# Patient Record
Sex: Female | Born: 1965 | Race: Black or African American | Hispanic: No | Marital: Single | Smoking: Never smoker
Health system: Southern US, Community
[De-identification: ages and names within clinical notes are randomized; demographics above are authoritative.]

## PROBLEM LIST (undated history)

## (undated) DIAGNOSIS — I10 Essential (primary) hypertension: Secondary | ICD-10-CM

## (undated) DIAGNOSIS — T7840XA Allergy, unspecified, initial encounter: Secondary | ICD-10-CM

## (undated) DIAGNOSIS — C50912 Malignant neoplasm of unspecified site of left female breast: Secondary | ICD-10-CM

## (undated) DIAGNOSIS — G43909 Migraine, unspecified, not intractable, without status migrainosus: Secondary | ICD-10-CM

## (undated) DIAGNOSIS — D649 Anemia, unspecified: Secondary | ICD-10-CM

## (undated) DIAGNOSIS — F32A Depression, unspecified: Secondary | ICD-10-CM

## (undated) HISTORY — PX: TUBAL LIGATION: SHX77

## (undated) HISTORY — PX: OTHER SURGICAL HISTORY: SHX169

## (undated) HISTORY — DX: Allergy, unspecified, initial encounter: T78.40XA

---

## 2006-12-27 ENCOUNTER — Ambulatory Visit: Payer: Self-pay | Admitting: Orthopedic Surgery

## 2007-01-03 ENCOUNTER — Ambulatory Visit: Payer: Self-pay | Admitting: Orthopedic Surgery

## 2007-04-21 ENCOUNTER — Other Ambulatory Visit: Payer: Self-pay

## 2007-04-21 ENCOUNTER — Emergency Department: Payer: Self-pay | Admitting: Emergency Medicine

## 2008-01-26 ENCOUNTER — Emergency Department: Payer: Self-pay | Admitting: Emergency Medicine

## 2010-11-01 ENCOUNTER — Emergency Department: Payer: Self-pay | Admitting: Emergency Medicine

## 2010-12-18 ENCOUNTER — Emergency Department: Payer: Self-pay | Admitting: Internal Medicine

## 2011-07-26 ENCOUNTER — Ambulatory Visit: Payer: Self-pay | Admitting: Internal Medicine

## 2011-08-13 ENCOUNTER — Ambulatory Visit: Payer: Self-pay | Admitting: Internal Medicine

## 2012-07-07 ENCOUNTER — Emergency Department: Payer: Self-pay | Admitting: Emergency Medicine

## 2013-02-27 ENCOUNTER — Emergency Department: Payer: Self-pay | Admitting: Emergency Medicine

## 2013-08-15 ENCOUNTER — Emergency Department: Payer: Self-pay | Admitting: Emergency Medicine

## 2017-01-08 ENCOUNTER — Encounter: Payer: Self-pay | Admitting: Internal Medicine

## 2017-01-08 ENCOUNTER — Ambulatory Visit (INDEPENDENT_AMBULATORY_CARE_PROVIDER_SITE_OTHER): Payer: 59 | Admitting: Internal Medicine

## 2017-01-08 VITALS — BP 124/86 | HR 72 | Ht 66.0 in | Wt 259.0 lb

## 2017-01-08 DIAGNOSIS — Z1231 Encounter for screening mammogram for malignant neoplasm of breast: Secondary | ICD-10-CM | POA: Diagnosis not present

## 2017-01-08 DIAGNOSIS — G43909 Migraine, unspecified, not intractable, without status migrainosus: Secondary | ICD-10-CM | POA: Diagnosis not present

## 2017-01-08 DIAGNOSIS — Z6841 Body Mass Index (BMI) 40.0 and over, adult: Secondary | ICD-10-CM | POA: Diagnosis not present

## 2017-01-08 DIAGNOSIS — M25561 Pain in right knee: Secondary | ICD-10-CM

## 2017-01-08 DIAGNOSIS — J3089 Other allergic rhinitis: Secondary | ICD-10-CM | POA: Diagnosis not present

## 2017-01-08 DIAGNOSIS — Z1239 Encounter for other screening for malignant neoplasm of breast: Secondary | ICD-10-CM

## 2017-01-08 NOTE — Progress Notes (Signed)
Date:  01/08/2017   Name:  Vickie Gutierrez   DOB:  01/13/1966   MRN:  [id number]   Chief Complaint: Establish Care (Was previous pt but need to be reestablished. ) and Throat Itching (Under chin pt gets itching. "not on outside of skin, but feels more like inside.") Migraine   This is a recurrent problem. The problem occurs intermittently (every 2-3 months). The pain is located in the bilateral and frontal region. Pertinent negatives include no abdominal pain or coughing. She has tried NSAIDs for the symptoms.  Seasonal allergies - she has fairly constant sx that are mostly mild, watery eyes and sneezing.  She also has itching in her throat - inside at times.  She does not think this is related to food. She does not like to take medication so has taken nothing for her sx.    Review of Systems  Constitutional: Negative for chills, diaphoresis and unexpected weight change.  HENT: Negative for trouble swallowing.        Itching in throat  Eyes: Positive for itching. Negative for visual disturbance.  Respiratory: Negative for cough, chest tightness and shortness of breath.   Cardiovascular: Negative for chest pain and palpitations.  Gastrointestinal: Negative for abdominal pain.  Endocrine: Negative for polydipsia and polyuria.  Genitourinary: Negative for difficulty urinating and menstrual problem.  Musculoskeletal: Positive for arthralgias (right knee).  Allergic/Immunologic: Positive for environmental allergies.    There are no active problems to display for this patient.   Prior to Admission medications   Not on File    No Known Allergies  Past Surgical History:  Procedure Laterality Date  . carpel tunnel    . TUBAL LIGATION      Social History  Substance Use Topics  . Smoking status: Never Smoker  . Smokeless tobacco: Never Used  . Alcohol use No     Medication list has been reviewed and updated.   Physical Exam  Constitutional: She is oriented to person,  place, and time. She appears well-developed. No distress.  HENT:  Head: Normocephalic and atraumatic.  Mouth/Throat: No posterior oropharyngeal edema or posterior oropharyngeal erythema.  Neck: Normal range of motion. Neck supple. No tracheal tenderness present. Carotid bruit is not present. No thyromegaly present.  Cardiovascular: Normal rate, regular rhythm and normal heart sounds.   Pulmonary/Chest: Effort normal and breath sounds normal. No respiratory distress. She has no wheezes.  Musculoskeletal: She exhibits no edema.       Right knee: She exhibits normal range of motion, no swelling and no effusion. Tenderness found.  Neurological: She is alert and oriented to person, place, and time.  Skin: Skin is warm and dry. No rash noted.  Psychiatric: She has a normal mood and affect. Her behavior is normal. Thought content normal.  Nursing note and vitals reviewed.   BP 124/86 (BP Location: Right Arm, Patient Position: Sitting, Cuff Size: Large)   Pulse 72   Ht 5\' 6"  (1.676 m)   Wt 259 lb (117.5 kg)   SpO2 98%   BMI 41.80 kg/m   Assessment and Plan: 1. Environmental and seasonal allergies Consider trial of Claritin for sx relief  2. Arthralgia of right knee May take tylenol of advil PRN  3. Breast cancer screening - MM DIGITAL SCREENING BILATERAL; Future  4. BMI 40.0-44.9, adult [facility](HCC) Discussed dietary changes   No orders of the defined types were placed in this encounter.  Patient will schedule CPX for Pap and labs.  Vernona RiegerLaura  Judithann Graves, MD [facility]  01/08/2017

## 2017-01-08 NOTE — Patient Instructions (Addendum)
Consider trying Claritin 10 mg once a day - a 2 week course. Breast Self-Awareness Breast self-awareness means being familiar with how your breasts look and feel. It involves checking your breasts regularly and reporting any changes to your health care provider. Practicing breast self-awareness is important. A change in your breasts can be a sign of a serious medical problem. Being familiar with how your breasts look and feel allows you to find any problems early, when treatment is more likely to be successful. All women should practice breast self-awareness, including women who have had breast implants. How to do a breast self-exam One way to learn what is normal for your breasts and whether your breasts are changing is to do a breast self-exam. To do a breast self-exam: Look for Changes   1. Remove all the clothing above your waist. 2. Stand in front of a mirror in a room with good lighting. 3. Put your hands on your hips. 4. Push your hands firmly downward. 5. Compare your breasts in the mirror. Look for differences between them (asymmetry), such as:  Differences in shape.  Differences in size.  Puckers, dips, and bumps in one breast and not the other. 6. Look at each breast for changes in your skin, such as:  Redness.  Scaly areas. 7. Look for changes in your nipples, such as:  Discharge.  Bleeding.  Dimpling.  Redness.  A change in position. Feel for Changes   Carefully feel your breasts for lumps and changes. It is best to do this while lying on your back on the floor and again while sitting or standing in the shower or tub with soapy water on your skin. Feel each breast in the following way:  Place the arm on the side of the breast you are examining above your head.  Feel your breast with the other hand.  Start in the nipple area and make  inch (2 cm) overlapping circles to feel your breast. Use the pads of your three middle fingers to do this. Apply light pressure,  then medium pressure, then firm pressure. The light pressure will allow you to feel the tissue closest to the skin. The medium pressure will allow you to feel the tissue that is a little deeper. The firm pressure will allow you to feel the tissue close to the ribs.  Continue the overlapping circles, moving downward over the breast until you feel your ribs below your breast.  Move one finger-width toward the center of the body. Continue to use the  inch (2 cm) overlapping circles to feel your breast as you move slowly up toward your collarbone.  Continue the up and down exam using all three pressures until you reach your armpit. Write Down What You Find   Write down what is normal for each breast and any changes that you find. Keep a written record with breast changes or normal findings for each breast. By writing this information down, you do not need to depend only on memory for size, tenderness, or location. Write down where you are in your menstrual cycle, if you are still menstruating. If you are having trouble noticing differences in your breasts, do not get discouraged. With time you will become more familiar with the variations in your breasts and more comfortable with the exam. How often should I examine my breasts? Examine your breasts every month. If you are breastfeeding, the best time to examine your breasts is after a feeding or after using a breast pump.  If you menstruate, the best time to examine your breasts is 5-7 days after your period is over. During your period, your breasts are lumpier, and it may be more difficult to notice changes. When should I see my health care provider? See your health care provider if you notice:  A change in shape or size of your breasts or nipples.  A change in the skin of your breast or nipples, such as a reddened or scaly area.  Unusual discharge from your nipples.  A lump or thick area that was not there before.  Pain in your  breasts.  Anything that concerns you. This information is not intended to replace advice given to you by your health care provider. Make sure you discuss any questions you have with your health care provider. Document Released: 10/08/2005 Document Revised: 03/15/2016 Document Reviewed: 08/28/2015 Elsevier Interactive Patient Education  2017 [employer].

## 2017-05-09 ENCOUNTER — Encounter: Payer: Self-pay | Admitting: Internal Medicine

## 2017-05-09 ENCOUNTER — Ambulatory Visit (INDEPENDENT_AMBULATORY_CARE_PROVIDER_SITE_OTHER): Payer: 59 | Admitting: Internal Medicine

## 2017-05-09 VITALS — BP 144/84 | HR 66 | Ht 66.0 in | Wt 255.0 lb

## 2017-05-09 DIAGNOSIS — G43909 Migraine, unspecified, not intractable, without status migrainosus: Secondary | ICD-10-CM | POA: Diagnosis not present

## 2017-05-09 DIAGNOSIS — R030 Elevated blood-pressure reading, without diagnosis of hypertension: Secondary | ICD-10-CM | POA: Diagnosis not present

## 2017-05-09 DIAGNOSIS — Z1231 Encounter for screening mammogram for malignant neoplasm of breast: Secondary | ICD-10-CM | POA: Diagnosis not present

## 2017-05-09 DIAGNOSIS — Z1211 Encounter for screening for malignant neoplasm of colon: Secondary | ICD-10-CM | POA: Diagnosis not present

## 2017-05-09 DIAGNOSIS — I10 Essential (primary) hypertension: Secondary | ICD-10-CM | POA: Insufficient documentation

## 2017-05-09 DIAGNOSIS — F329 Major depressive disorder, single episode, unspecified: Secondary | ICD-10-CM

## 2017-05-09 DIAGNOSIS — Z0000 Encounter for general adult medical examination without abnormal findings: Secondary | ICD-10-CM

## 2017-05-09 DIAGNOSIS — Z1239 Encounter for other screening for malignant neoplasm of breast: Secondary | ICD-10-CM

## 2017-05-09 LAB — POCT URINALYSIS DIPSTICK
Bilirubin, UA: NEGATIVE
GLUCOSE UA: NEGATIVE
KETONES UA: NEGATIVE
LEUKOCYTES UA: NEGATIVE
Nitrite, UA: NEGATIVE
PROTEIN UA: NEGATIVE
Spec Grav, UA: <=1.005 — AB (ref 1.010–1.025)
Urobilinogen, UA: 0.2 E.U./dL
pH, UA: 5 (ref 5.0–8.0)

## 2017-05-09 MED ORDER — CITALOPRAM HYDROBROMIDE 20 MG PO TABS: 20.0000 mg | ORAL_TABLET | Freq: Every day | ORAL | 1 refills | Status: DC

## 2017-05-09 NOTE — Patient Instructions (Signed)

## 2017-05-09 NOTE — Progress Notes (Signed)
Date:  05/09/2017   Name:  Vickie Gutierrez   DOB:  07/06/1966   MRN:  [id number]   Chief Complaint: Annual Exam (PHQ9- 18. Breast exam and pap. ) Vickie Gutierrez is a 51 y.o. female who presents today for her Complete Annual Exam. She feels poorly. She reports exercising none. She reports she is sleeping poorly. Grieveing over her mothers death in 01/13/2023.  Not getting better.  Poor sleep, negative thoughts, frequent crying, low energy. She is still getting to work daily and taking care of her children.  Migraine HA - still occurring several times per month but essentially unchanged.   Review of Systems  Constitutional: Negative for chills, fatigue and fever.  HENT: Negative for congestion, hearing loss, tinnitus, trouble swallowing and voice change.   Eyes: Negative for visual disturbance.  Respiratory: Negative for cough, chest tightness, shortness of breath and wheezing.   Cardiovascular: Negative for chest pain, palpitations and leg swelling.  Gastrointestinal: Negative for abdominal pain, constipation, diarrhea and vomiting.  Endocrine: Negative for polydipsia and polyuria.  Genitourinary: Negative.  Negative for dysuria, frequency, genital sores, menstrual problem, vaginal bleeding and vaginal discharge.  Musculoskeletal: Negative for arthralgias, gait problem and joint swelling.  Skin: Negative for color change and rash.       Hair loss  Neurological: Positive for headaches (migraines unchanged). Negative for dizziness, tremors and light-headedness.  Hematological: Negative for adenopathy. Does not bruise/bleed easily.  Psychiatric/Behavioral: Positive for dysphoric mood and sleep disturbance. Negative for suicidal ideas. The patient is not nervous/anxious.     Patient Active Problem List   Diagnosis Date Noted  . BMI 40.0-44.9, adult (HCC) 01/08/2017  . Environmental and seasonal allergies 01/08/2017  . Migraine without status migrainosus, not intractable 01/08/2017  .  Arthralgia of right knee 01/08/2017    Prior to Admission medications   Not on File    No Known Allergies  Past Surgical History:  Procedure Laterality Date  . carpel tunnel    . TUBAL LIGATION      Social History  Substance Use Topics  . Smoking status: Never Smoker  . Smokeless tobacco: Never Used  . Alcohol use No   Depression screen PHQ 2/9 05/09/2017  Decreased Interest 2  Down, Depressed, Hopeless 2  PHQ - 2 Score 4  Altered sleeping 3  Tired, decreased energy 3  Change in appetite 1  Feeling bad or failure about yourself  3  Trouble concentrating 1  Moving slowly or fidgety/restless 1  Suicidal thoughts 2  PHQ-9 Score 18  Difficult doing work/chores Extremely dIfficult     Medication list has been reviewed and updated.   Physical Exam  Constitutional: She is oriented to person, place, and time. She appears well-developed and well-nourished. No distress.  HENT:  Head: Normocephalic and atraumatic.  Right Ear: Tympanic membrane and ear canal normal.  Left Ear: Tympanic membrane and ear canal normal.  Nose: Right sinus exhibits no maxillary sinus tenderness. Left sinus exhibits no maxillary sinus tenderness.  Mouth/Throat: Uvula is midline and oropharynx is clear and moist.  Eyes: Conjunctivae and EOM are normal. Right eye exhibits no discharge. Left eye exhibits no discharge. No scleral icterus.  Neck: Normal range of motion. Carotid bruit is not present. No erythema present. No thyromegaly present.  Cardiovascular: Normal rate, regular rhythm, normal heart sounds and normal pulses.   Pulmonary/Chest: Effort normal. No respiratory distress. She has no wheezes. Right breast exhibits no mass, no nipple discharge, no skin  change and no tenderness. Left breast exhibits no mass, no nipple discharge, no skin change and no tenderness.  Abdominal: Soft. Bowel sounds are normal. There is no hepatosplenomegaly. There is no tenderness. There is no CVA tenderness.    Genitourinary: Vagina normal and uterus normal. There is no rash, tenderness or lesion on the right labia. There is no rash, tenderness or lesion on the left labia. Cervix exhibits no motion tenderness, no discharge and no friability. Right adnexum displays no mass, no tenderness and no fullness. Left adnexum displays no mass, no tenderness and no fullness.  Musculoskeletal: Normal range of motion.  Lymphadenopathy:    She has no cervical adenopathy.    She has no axillary adenopathy.  Neurological: She is alert and oriented to person, place, and time. She has normal reflexes. No cranial nerve deficit or sensory deficit.  Skin: Skin is warm, dry and intact. No rash noted.  Psychiatric: Her speech is normal and behavior is normal. Thought content normal. Her mood appears not anxious. Cognition and memory are normal. She exhibits a depressed mood.  Nursing note and vitals reviewed.   BP (!) 144/84   Pulse 66   Ht 5\' 6"  (1.676 m)   Wt 255 lb (115.7 kg)   LMP 10/22/2012 (Approximate) Comment: no periods.   SpO2 100%   BMI 41.16 kg/m   Assessment and Plan: 1. Annual physical exam BP elevated due to current stress and emotional upset Will recheck next visit - CBC with Differential/Platelet - Comprehensive metabolic panel - Lipid panel - POCT urinalysis dipstick - Pap IG and HPV (high risk) DNA detection  2. Breast cancer screening - MM DIGITAL SCREENING BILATERAL; Future  3. Colon cancer screening Will discuss next visit  4. Migraine without status migrainosus, not intractable, unspecified migraine type Stable sx  5. Reactive depression More prolonged than expected - begin medication - TSH - citalopram (CELEXA) 20 MG tablet; Take 1 tablet (20 mg total) by mouth daily.  Dispense: 30 tablet; Refill: 1   Meds ordered this encounter  Medications  . citalopram (CELEXA) 20 MG tablet    Sig: Take 1 tablet (20 mg total) by mouth daily.    Dispense:  30 tablet    Refill:  1     Bari EdwardLaura Taylour Lietzke, MD [facility]  05/09/2017

## 2017-05-10 LAB — COMPREHENSIVE METABOLIC PANEL
ALBUMIN: 4.2 g/dL (ref 3.5–5.5)
ALK PHOS: 95 IU/L (ref 39–117)
ALT: 9 IU/L (ref 0–32)
AST: 8 IU/L (ref 0–40)
Albumin/Globulin Ratio: 1.4 (ref 1.2–2.2)
BILIRUBIN TOTAL: 0.2 mg/dL (ref 0.0–1.2)
BUN / CREAT RATIO: 11 (ref 9–23)
BUN: 11 mg/dL (ref 6–24)
CO2: 24 mmol/L (ref 20–29)
CREATININE: 1.03 mg/dL — AB (ref 0.57–1.00)
Calcium: 9.7 mg/dL (ref 8.7–10.2)
Chloride: 104 mmol/L (ref 96–106)
GFR calc non Af Amer: 64 mL/min/{1.73_m2} (ref >59)
GFR, EST AFRICAN AMERICAN: 73 mL/min/{1.73_m2} (ref >59)
GLOBULIN, TOTAL: 3.1 g/dL (ref 1.5–4.5)
Glucose: 82 mg/dL (ref 65–99)
Potassium: 5 mmol/L (ref 3.5–5.2)
SODIUM: 145 mmol/L — AB (ref 134–144)
TOTAL PROTEIN: 7.3 g/dL (ref 6.0–8.5)

## 2017-05-10 LAB — CBC WITH DIFFERENTIAL/PLATELET
BASOS: 0 %
Basophils Absolute: 0 10*3/uL (ref 0.0–0.2)
EOS (ABSOLUTE): 0.2 10*3/uL (ref 0.0–0.4)
Eos: 3 %
Hematocrit: 39.4 % (ref 34.0–46.6)
Hemoglobin: 12.8 g/dL (ref 11.1–15.9)
Immature Grans (Abs): 0 10*3/uL (ref 0.0–0.1)
Immature Granulocytes: 0 %
LYMPHS ABS: 2.1 10*3/uL (ref 0.7–3.1)
Lymphs: 31 %
MCH: 27.4 pg (ref 26.6–33.0)
MCHC: 32.5 g/dL (ref 31.5–35.7)
MCV: 84 fL (ref 79–97)
MONOS ABS: 0.5 10*3/uL (ref 0.1–0.9)
Monocytes: 7 %
NEUTROS ABS: 4.1 10*3/uL (ref 1.4–7.0)
Neutrophils: 59 %
Platelets: 246 10*3/uL (ref 150–379)
RBC: 4.67 x10E6/uL (ref 3.77–5.28)
RDW: 15 % (ref 12.3–15.4)
WBC: 6.9 10*3/uL (ref 3.4–10.8)

## 2017-05-10 LAB — LIPID PANEL
CHOL/HDL RATIO: 2.8 ratio (ref 0.0–4.4)
Cholesterol, Total: 143 mg/dL (ref 100–199)
HDL: 51 mg/dL (ref >39)
LDL Calculated: 76 mg/dL (ref 0–99)
TRIGLYCERIDES: 79 mg/dL (ref 0–149)
VLDL Cholesterol Cal: 16 mg/dL (ref 5–40)

## 2017-05-10 LAB — TSH: TSH: 2.26 u[IU]/mL (ref 0.450–4.500)

## 2017-05-11 LAB — PAP IG AND HPV HIGH-RISK
HPV, HIGH-RISK: NEGATIVE
PAP Smear Comment: 0

## 2017-05-11 LAB — PLEASE NOTE

## 2017-06-26 ENCOUNTER — Ambulatory Visit (INDEPENDENT_AMBULATORY_CARE_PROVIDER_SITE_OTHER): Payer: 59 | Admitting: Internal Medicine

## 2017-06-26 ENCOUNTER — Encounter: Payer: Self-pay | Admitting: Internal Medicine

## 2017-06-26 VITALS — BP 138/80 | HR 67 | Ht 66.0 in | Wt 258.0 lb

## 2017-06-26 DIAGNOSIS — S63633A Sprain of interphalangeal joint of left middle finger, initial encounter: Secondary | ICD-10-CM | POA: Diagnosis not present

## 2017-06-26 DIAGNOSIS — F329 Major depressive disorder, single episode, unspecified: Secondary | ICD-10-CM

## 2017-06-26 NOTE — Patient Instructions (Signed)
Try celexa again - in evening 1/4 tab for 6 days, then 1/2 tab for 6 days, then 1 tab daily

## 2017-06-26 NOTE — Progress Notes (Signed)
Date:  06/26/2017   Name:  Vickie ReveringZsa Zsa M Flagler   DOB:  06/23/1966   MRN:  [id number]   Chief Complaint: Depression (Patient only took Celexa for 1 day. Did not like the way it made her feel. Made her feel nausea. PHQ9- 6 ) and Hand Pain (Almost a week- Middle Left finger is painful. Can't bend all the way down and is swollen. )  Finger pain - left middle finger swollen and stiff for the past week.  Also right thumb stiff at [facility] where she stabbed it with a piece of glass.   Depression - actually doing better but was unable to take it due to lethargy and dizziness.  She also had some nausea.  She only took one.  Review of Systems  Constitutional: Negative for fatigue and fever.  Respiratory: Negative for chest tightness, shortness of breath and wheezing.   Cardiovascular: Negative for chest pain, palpitations and leg swelling.  Musculoskeletal: Positive for arthralgias and joint swelling. Negative for gait problem.  Skin: Negative for color change and rash.  Neurological: Negative for dizziness and headaches.  Psychiatric/Behavioral: Positive for dysphoric mood. Negative for sleep disturbance.    Patient Active Problem List   Diagnosis Date Noted  . Elevated blood pressure reading 05/09/2017  . Reactive depression 05/09/2017  . BMI 40.0-44.9, adult (HCC) 01/08/2017  . Environmental and seasonal allergies 01/08/2017  . Migraine without status migrainosus, not intractable 01/08/2017  . Arthralgia of right knee 01/08/2017    Prior to Admission medications   Medication Sig Start Date End Date Taking? Authorizing Provider  citalopram (CELEXA) 20 MG tablet Take 1 tablet (20 mg total) by mouth daily. Patient not taking: Reported on 06/26/2017 05/09/17   Reubin MilanBerglund, Laura H, MD    No Known Allergies  Past Surgical History:  Procedure Laterality Date  . carpel tunnel    . TUBAL LIGATION      Social History  Substance Use Topics  . Smoking status: Never Smoker  . Smokeless tobacco:  Never Used  . Alcohol use No     Medication list has been reviewed and updated.  PHQ 2/9 Scores 06/26/2017 05/09/2017  PHQ - 2 Score 1 4  PHQ- 9 Score 6 18    Physical Exam  Constitutional: She is oriented to person, place, and time. She appears well-developed. No distress.  HENT:  Head: Normocephalic and atraumatic.  Cardiovascular: Normal rate, regular rhythm and normal heart sounds.   Pulmonary/Chest: Effort normal. No respiratory distress. She has wheezes.  Musculoskeletal: Normal range of motion.  Left middle finger swollen at PIP - not red or warm Able to flex about halfway  Right thumb prox joint tender - not swollen  Neurological: She is alert and oriented to person, place, and time.  Skin: Skin is warm and dry. No rash noted.  Psychiatric: She has a normal mood and affect. Her behavior is normal. Thought content normal.  Nursing note and vitals reviewed.   BP 138/80   Pulse 67   Ht 5\' 6"  (1.676 m)   Wt 258 lb (117 kg)   LMP 10/22/2012 (Approximate) Comment: no periods.   SpO2 100%   BMI 41.64 kg/m   Assessment and Plan: 1. Reactive depression Restart Celexa 1/4 dose then work up slowly  2. Sprain of interphalangeal joint of left middle finger, initial encounter Recommend Advil bid Follow up if no improvement   No orders of the defined types were placed in this encounter.   Partially dictated  using Animal nutritionist. Any errors are unintentional.  Bari Edward, MD [facility]  06/26/2017

## 2017-07-03 ENCOUNTER — Ambulatory Visit
Admission: RE | Admit: 2017-07-03 | Discharge: 2017-07-03 | Disposition: A | Discharge status: Home / Self Care | Payer: 59 | Source: Ambulatory Visit | Attending: Internal Medicine | Admitting: Internal Medicine

## 2017-07-03 ENCOUNTER — Encounter: Payer: Self-pay | Admitting: Internal Medicine

## 2017-07-03 ENCOUNTER — Other Ambulatory Visit: Payer: Self-pay | Admitting: Internal Medicine

## 2017-07-03 ENCOUNTER — Ambulatory Visit (INDEPENDENT_AMBULATORY_CARE_PROVIDER_SITE_OTHER): Payer: 59 | Admitting: Internal Medicine

## 2017-07-03 VITALS — BP 128/84 | HR 67 | Ht 66.0 in | Wt 258.0 lb

## 2017-07-03 DIAGNOSIS — M25542 Pain in joints of left hand: Secondary | ICD-10-CM

## 2017-07-03 DIAGNOSIS — M25442 Effusion, left hand: Secondary | ICD-10-CM

## 2017-07-03 NOTE — Progress Notes (Signed)
    Date:  07/03/2017   Name:  Vickie Gutierrez   DOB:  06/02/1966   MRN:  [id number]   Chief Complaint: Hand Pain (Finger getting worse - Swollen. Left Middle Finger )  Pain in left middle finger persists. It is still swollen and slightly warm. Again reviewed no history of injury. No history of rheumatologic disorders. No history of gout. She's been taking ibuprofen without much benefit. No other joints and become swollen or painful.  Review of Systems  Constitutional: Negative for chills, fatigue and fever.  Respiratory: Negative for chest tightness and shortness of breath.   Cardiovascular: Negative for chest pain.  Musculoskeletal: Positive for arthralgias and joint swelling.  Neurological: Negative for weakness and numbness.    Patient Active Problem List   Diagnosis Date Noted  . Elevated blood pressure reading 05/09/2017  . Reactive depression 05/09/2017  . BMI 40.0-44.9, adult (HCC) 01/08/2017  . Environmental and seasonal allergies 01/08/2017  . Migraine without status migrainosus, not intractable 01/08/2017  . Arthralgia of right knee 01/08/2017    Prior to Admission medications   Medication Sig Start Date End Date Taking? Authorizing Provider  citalopram (CELEXA) 20 MG tablet Take 1 tablet (20 mg total) by mouth daily. 05/09/17  Yes Reubin MilanBerglund, Alexio Sroka H, MD    No Known Allergies  Past Surgical History:  Procedure Laterality Date  . carpel tunnel    . TUBAL LIGATION      Social History  Substance Use Topics  . Smoking status: Never Smoker  . Smokeless tobacco: Never Used  . Alcohol use No     Medication list has been reviewed and updated.  PHQ 2/9 Scores 06/26/2017 05/09/2017  PHQ - 2 Score 1 4  PHQ- 9 Score 6 18    Physical Exam  Constitutional: She is oriented to person, place, and time. She appears well-developed. No distress.  HENT:  Head: Normocephalic and atraumatic.  Cardiovascular: Normal rate, regular rhythm and normal heart sounds.     Pulmonary/Chest: Effort normal and breath sounds normal. No respiratory distress. She has no wheezes.  Musculoskeletal: Normal range of motion.  Left middle finger swollen at PIP - not red but slightly warm to touch Able to flex about halfway  Right thumb prox joint non tender  Neurological: She is alert and oriented to person, place, and time.  Skin: Skin is warm and dry. No rash noted.  Psychiatric: She has a normal mood and affect. Her speech is normal and behavior is normal. Thought content normal.  Nursing note and vitals reviewed.   BP 128/84   Pulse 67   Ht 5\' 6"  (1.676 m)   Wt 258 lb (117 kg)   LMP 10/22/2012 (Approximate) Comment: no periods.   SpO2 100%   BMI 41.64 kg/m   Assessment and Plan: 1. Pain involving joint of finger of left hand Patient declines serologic testing - maybe xray will provide some etiology - DG Finger Middle Left; Future   No orders of the defined types were placed in this encounter.   Partially dictated using Animal nutritionistDragon software. Any errors are unintentional.  Bari EdwardLaura Camilah Spillman, MD [facility]  07/03/2017

## 2017-11-14 ENCOUNTER — Encounter (HOSPITAL_COMMUNITY): Payer: Self-pay | Admitting: Emergency Medicine

## 2017-11-14 ENCOUNTER — Ambulatory Visit (HOSPITAL_COMMUNITY)
Admission: EM | Admit: 2017-11-14 | Discharge: 2017-11-14 | Disposition: A | Discharge status: Home / Self Care | Payer: BLUE CROSS/BLUE SHIELD | Attending: Family Medicine | Admitting: Family Medicine

## 2017-11-14 DIAGNOSIS — R69 Illness, unspecified: Secondary | ICD-10-CM | POA: Diagnosis not present

## 2017-11-14 DIAGNOSIS — J111 Influenza due to unidentified influenza virus with other respiratory manifestations: Secondary | ICD-10-CM

## 2017-11-14 MED ORDER — GUAIFENESIN-DM 100-10 MG/5ML PO SYRP: 10.0000 mL | ORAL_SOLUTION | Freq: Four times a day (QID) | ORAL | 0 refills | Status: AC | PRN

## 2017-11-14 MED ORDER — ONDANSETRON HCL 4 MG PO TABS: 4.0000 mg | ORAL_TABLET | Freq: Four times a day (QID) | ORAL | 0 refills | Status: AC

## 2017-11-14 MED ORDER — HYDROCODONE-HOMATROPINE 5-1.5 MG/5ML PO SYRP: 5.0000 mL | ORAL_SOLUTION | Freq: Four times a day (QID) | ORAL | 0 refills | Status: AC | PRN

## 2017-11-14 NOTE — ED Provider Notes (Signed)
[facility]    CSN: [id number] Arrival date & time: 11/14/17  1353     History   Chief Complaint Chief Complaint  Patient presents with  . URI    HPI Vickie Gutierrez is a 52 y.o. female Patient is presenting with URI symptoms- congestion, cough, sore throat. Also having fever earlier in the week up to 101. Body aches. Endorsing nausea, vomiting 3 days ago, loss of appetite. Patient's main complaints are overall feeling poor/weak. Symptoms have been going on for 4-5 days. Patient has tried ibuprofen Saturday night, has not tried OTC medicaitons. Deniesdiarrhea. Endorsing some shortness of breath and weakness. Chest pain from coughing. No flu shot this year, son recently with flu.   HPI  Past Medical History:  Diagnosis Date  . Allergy    seasonal    Patient Active Problem List   Diagnosis Date Noted  . Elevated blood pressure reading 05/09/2017  . Reactive depression 05/09/2017  . BMI 40.0-44.9, adult (HCC) 01/08/2017  . Environmental and seasonal allergies 01/08/2017  . Migraine without status migrainosus, not intractable 01/08/2017  . Arthralgia of right knee 01/08/2017    Past Surgical History:  Procedure Laterality Date  . carpel tunnel    . TUBAL LIGATION      OB History    No data available       Home Medications    Prior to Admission medications   Medication Sig Start Date End Date Taking? Authorizing Provider  citalopram (CELEXA) 20 MG tablet Take 1 tablet (20 mg total) by mouth daily. 05/09/17   Reubin MilanBerglund, Laura H, MD  guaiFENesin-dextromethorphan (ROBITUSSIN DM) 100-10 MG/5ML syrup Take 10 mLs by mouth every 6 (six) hours as needed for up to 5 days for cough. 11/14/17 11/19/17  Wieters, Hallie C, PA-C  HYDROcodone-homatropine (HYCODAN) 5-1.5 MG/5ML syrup Take 5 mLs by mouth every 6 (six) hours as needed for up to 5 days for cough. Take at bedtime 11/14/17 11/19/17  Wieters, Hallie C, PA-C  ondansetron (ZOFRAN) 4 MG tablet Take 1 tablet (4 mg  total) by mouth every 6 (six) hours for 4 days. 11/14/17 11/18/17  Wieters, Junius CreamerHallie C, PA-C    Family History Family History  Problem Relation Age of Onset  . Ovarian cancer Mother   . Diabetes Mother   . Hypertension Mother   . Diabetes Maternal Grandmother   . Hypertension Maternal Grandmother     Social History Social History   Tobacco Use  . Smoking status: Never Smoker  . Smokeless tobacco: Never Used  Substance Use Topics  . Alcohol use: No  . Drug use: No     Allergies   Patient has no known allergies.   Review of Systems Review of Systems  Constitutional: Positive for fever. Negative for chills and fatigue.  HENT: Positive for congestion, ear pain, rhinorrhea, sinus pressure and sore throat. Negative for trouble swallowing.   Respiratory: Positive for cough and shortness of breath. Negative for chest tightness.   Cardiovascular: Negative for chest pain.  Gastrointestinal: Positive for nausea and vomiting. Negative for abdominal pain.  Musculoskeletal: Positive for myalgias.  Skin: Negative for rash.  Neurological: Positive for dizziness and weakness. Negative for light-headedness and headaches.     Physical Exam Triage Vital Signs ED Triage Vitals  Enc Vitals Group     BP 11/14/17 1409 (!) 141/99     Pulse Rate 11/14/17 1409 86     Resp 11/14/17 1409 16     Temp 11/14/17 1409 98.6  F (37 C)     Temp Source 11/14/17 1409 Oral     SpO2 11/14/17 1409 97 %     Weight 11/14/17 1408 250 lb (113.4 kg)     Height 11/14/17 1408 5\' 6"  (1.676 m)     Head Circumference --      Peak Flow --      Pain Score 11/14/17 1407 6     Pain Loc --      Pain Edu? --      Excl. in GC? --    No data found.  Updated Vital Signs BP (!) 141/99   Pulse 86   Temp 98.6 F (37 C) (Oral)   Resp 16   Ht 5\' 6"  (1.676 m)   Wt 250 lb (113.4 kg)   LMP 10/22/2012 (Approximate) Comment: no periods.   SpO2 97%   BMI 40.35 kg/m   Physical Exam  Constitutional: She is oriented  to person, place, and time. She appears well-developed and well-nourished. No distress.  HENT:  Head: Normocephalic and atraumatic.  Right Ear: Tympanic membrane and ear canal normal.  Left Ear: Tympanic membrane and ear canal normal.  Nose: Rhinorrhea present.  Mouth/Throat: Uvula is midline and mucous membranes are normal. No oral lesions. No trismus in the jaw. No uvula swelling. Posterior oropharyngeal erythema present. No tonsillar exudate.  Erythematous turbinates  Eyes: Conjunctivae are normal.  Neck: Neck supple.  Cardiovascular: Normal rate and regular rhythm.  No murmur heard. Pulmonary/Chest: Effort normal and breath sounds normal. No respiratory distress. She has no wheezes. She has no rales.  CTABL, no adventitious sounds, breathing comfortably at rest  Abdominal: Soft. There is no tenderness.  Musculoskeletal: She exhibits no edema.  Neurological: She is alert and oriented to person, place, and time.  Skin: Skin is warm and dry.  Psychiatric: She has a normal mood and affect.  Nursing note and vitals reviewed.    UC Treatments / Results  Labs (all labs ordered are listed, but only abnormal results are displayed) Labs Reviewed - No data to display  EKG  EKG Interpretation None       Radiology No results found.  Procedures Procedures (including critical care time)  Medications Ordered in UC Medications - No data to display   Initial Impression / Assessment and Plan / UC Course  I have reviewed the triage vital signs and the nursing notes.  Pertinent labs & imaging results that were available during my care of the patient were reviewed by me and considered in my medical decision making (see chart for details).     Patient presents with symptoms likely from influenza vs. viral upper respiratory infection. Seems to be slightly improved from earlier in the week. VSS, deferred Chest XR as lungs clear, O2 97%, no fever or tachycardia. Do not suspect  underlying cardiopulmonary process. Symptoms seem unlikely related to ACS, CHF or COPD exacerbations, pneumonia, pneumothorax. Patient is nontoxic appearing and not in need of emergent medical intervention.  Expect worst of symptoms to be behind patient, expect slow gradual improvement over next week. Hycodan for cough at night, Robitussin DM during day. Zofran for nausea- stressed importance of hydration/eating.  Recommended symptom control with over the counter medications: Daily oral anti-histamine, Oral decongestant or IN corticosteroid, saline irrigations, cepacol lozenges, Robitussin, Delsym, honey tea.  Return if symptoms fail to improve in 1-2 weeks or you develop shortness of breath, chest pain, severe headache. Patient states understanding and is agreeable. Discussed strict return precautions. Patient  verbalized understanding and is agreeable with plan.   Final Clinical Impressions(s) / UC Diagnoses   Final diagnoses:  Influenza-like illness    ED Discharge Orders        Ordered    HYDROcodone-homatropine (HYCODAN) 5-1.5 MG/5ML syrup  Every 6 hours PRN     11/14/17 1459    guaiFENesin-dextromethorphan (ROBITUSSIN DM) 100-10 MG/5ML syrup  Every 6 hours PRN     11/14/17 1459    ondansetron (ZOFRAN) 4 MG tablet  Every 6 hours     11/14/17 1459       Controlled Substance Prescriptions [place] Controlled Substance Registry consulted? Yes, I have consulted the [place] Controlled Substances Registry for this patient, and feel the risk/benefit ratio today is favorable for proceeding with this prescription for a controlled substance.   Lew Dawes, PA-C 11/14/17 1511

## 2017-11-14 NOTE — Discharge Instructions (Signed)
You are likely on the tail end of the flu. I expect symptoms to improve over the next week. Please use recommendations below to help control your symptoms:   1. Take a daily allergy pill/anti-histamine like Zyrtec, Claritin, or Store brand consistently for 2 weeks  2. For congestion you may try an oral decongestant like Mucinex or sudafed. You may also try intranasal flonase nasal spray or saline irrigations (neti pot, sinus cleanse)  3. For your sore throat you may try cepacol lozenges, salt water gargles, throat spray. Treatment of congestion may also help your sore throat.  4. For cough you may try Robitussin DM during the day, Hycodan at night to help with nighttime cough- will cause sedation, do not drive after use.   5. Take Tylenol or Ibuprofen to help with pain- alternate every 4 hours  6. Stay hydrated, drink plenty of fluids to keep throat coated and less irritated; try to eat more, start with blander foods then transition to normal diet.   Honey Tea For cough/sore throat try using a honey-based tea. Use 3 teaspoons of honey with juice squeezed from half lemon. Place shaved pieces of ginger into 1/2-1 cup of water and warm over stove top. Then mix the ingredients and repeat every 4 hours as needed.  Please return if symptoms not improving or worsening, develop increased shortness of breath, difficulty breathing, chest pain, fever returns

## 2017-11-14 NOTE — ED Triage Notes (Addendum)
PT reports cough, weakness, dizziness, chest pain with cough for 4-5 days. PT reports she is eating and drinking less than normal. PT reports SOB as well

## 2021-09-28 ENCOUNTER — Other Ambulatory Visit: Payer: Self-pay | Admitting: Family Medicine

## 2021-09-28 DIAGNOSIS — Z1231 Encounter for screening mammogram for malignant neoplasm of breast: Secondary | ICD-10-CM

## 2021-11-15 ENCOUNTER — Other Ambulatory Visit: Payer: Self-pay

## 2021-11-15 ENCOUNTER — Ambulatory Visit
Admission: RE | Admit: 2021-11-15 | Discharge: 2021-11-15 | Disposition: A | Discharge status: Home / Self Care | Payer: BC Managed Care – PPO | Source: Ambulatory Visit | Attending: Family Medicine | Admitting: Family Medicine

## 2021-11-15 DIAGNOSIS — Z1231 Encounter for screening mammogram for malignant neoplasm of breast: Secondary | ICD-10-CM | POA: Diagnosis present

## 2022-10-01 DIAGNOSIS — Z1331 Encounter for screening for depression: Secondary | ICD-10-CM | POA: Diagnosis not present

## 2022-10-01 DIAGNOSIS — Z1211 Encounter for screening for malignant neoplasm of colon: Secondary | ICD-10-CM | POA: Diagnosis not present

## 2022-10-01 DIAGNOSIS — Z0000 Encounter for general adult medical examination without abnormal findings: Secondary | ICD-10-CM | POA: Diagnosis not present

## 2023-04-04 ENCOUNTER — Encounter: Payer: Self-pay | Admitting: Gastroenterology

## 2023-04-05 ENCOUNTER — Encounter: Payer: Self-pay | Admitting: Gastroenterology

## 2023-04-05 ENCOUNTER — Encounter
Admission: RE | Disposition: A | Discharge status: Home / Self Care | Payer: Self-pay | Source: Home / Self Care | Attending: Gastroenterology

## 2023-04-05 ENCOUNTER — Ambulatory Visit: Payer: BC Managed Care – PPO | Admitting: General Practice

## 2023-04-05 ENCOUNTER — Ambulatory Visit
Admission: RE | Admit: 2023-04-05 | Discharge: 2023-04-05 | Disposition: A | Discharge status: Home / Self Care | Payer: BC Managed Care – PPO | Attending: Gastroenterology | Admitting: Gastroenterology

## 2023-04-05 DIAGNOSIS — Z1211 Encounter for screening for malignant neoplasm of colon: Secondary | ICD-10-CM | POA: Diagnosis not present

## 2023-04-05 DIAGNOSIS — K635 Polyp of colon: Secondary | ICD-10-CM | POA: Diagnosis not present

## 2023-04-05 DIAGNOSIS — Z6841 Body Mass Index (BMI) 40.0 and over, adult: Secondary | ICD-10-CM | POA: Diagnosis not present

## 2023-04-05 DIAGNOSIS — D120 Benign neoplasm of cecum: Secondary | ICD-10-CM | POA: Insufficient documentation

## 2023-04-05 DIAGNOSIS — F32A Depression, unspecified: Secondary | ICD-10-CM | POA: Diagnosis not present

## 2023-04-05 DIAGNOSIS — D125 Benign neoplasm of sigmoid colon: Secondary | ICD-10-CM | POA: Diagnosis not present

## 2023-04-05 DIAGNOSIS — D123 Benign neoplasm of transverse colon: Secondary | ICD-10-CM | POA: Diagnosis not present

## 2023-04-05 HISTORY — PX: COLONOSCOPY WITH PROPOFOL: SHX5780

## 2023-04-05 SURGERY — COLONOSCOPY WITH PROPOFOL
Anesthesia: General

## 2023-04-05 MED ORDER — SODIUM CHLORIDE 0.9 % IV SOLN
INTRAVENOUS | Status: DC
  Administered 2023-04-05: 20 mL/h via INTRAVENOUS

## 2023-04-05 MED ORDER — LIDOCAINE HCL (CARDIAC) PF 100 MG/5ML IV SOSY
PREFILLED_SYRINGE | INTRAVENOUS | Status: DC | PRN
  Administered 2023-04-05: 50 mg via INTRAVENOUS

## 2023-04-05 MED ORDER — PROPOFOL 10 MG/ML IV BOLUS
INTRAVENOUS | Status: DC | PRN
  Administered 2023-04-05: 80 mg via INTRAVENOUS

## 2023-04-05 MED ORDER — PROPOFOL 500 MG/50ML IV EMUL
INTRAVENOUS | Status: DC | PRN
  Administered 2023-04-05: 150 ug/kg/min via INTRAVENOUS

## 2023-04-05 NOTE — Anesthesia Preprocedure Evaluation (Signed)
Anesthesia Evaluation  Patient identified by MRN, date of birth, ID band Patient awake    Reviewed: Allergy & Precautions, NPO status , Patient's Chart, lab work & pertinent test results  Airway Mallampati: III  TM Distance: >3 FB Neck ROM: full    Dental  (+) Chipped   Pulmonary neg pulmonary ROS   Pulmonary exam normal        Cardiovascular negative cardio ROS Normal cardiovascular exam     Neuro/Psych  PSYCHIATRIC DISORDERS  Depression    negative neurological ROS     GI/Hepatic negative GI ROS, Neg liver ROS,,,  Endo/Other    Morbid obesity  Renal/GU negative Renal ROS  negative genitourinary   Musculoskeletal   Abdominal   Peds  Hematology negative hematology ROS (+)   Anesthesia Other Findings Past Medical History: No date: Allergy     Comment:  seasonal  Past Surgical History: No date: carpel tunnel No date: TUBAL LIGATION  BMI    Body Mass Index: 43.97 kg/m      Reproductive/Obstetrics negative OB ROS                             Anesthesia Physical Anesthesia Plan  ASA: 3  Anesthesia Plan: General   Post-op Pain Management: Minimal or no pain anticipated   Induction: Intravenous  PONV Risk Score and Plan: 3 and Propofol infusion, TIVA and Ondansetron  Airway Management Planned: Nasal Cannula  Additional Equipment: None  Intra-op Plan:   Post-operative Plan:   Informed Consent: I have reviewed the patients History and Physical, chart, labs and discussed the procedure including the risks, benefits and alternatives for the proposed anesthesia with the patient or authorized representative who has indicated his/her understanding and acceptance.     Dental advisory given  Plan Discussed with: CRNA and Surgeon  Anesthesia Plan Comments: (Discussed risks of anesthesia with patient, including possibility of difficulty with spontaneous ventilation under  anesthesia necessitating airway intervention, PONV, and rare risks such as cardiac or respiratory or neurological events, and allergic reactions. Discussed the role of CRNA in patient's perioperative care. Patient understands.)        Anesthesia Quick Evaluation

## 2023-04-05 NOTE — Anesthesia Postprocedure Evaluation (Signed)
Anesthesia Post Note  Patient: Vickie Gutierrez  Procedure(s) Performed: COLONOSCOPY WITH PROPOFOL  Patient location during evaluation: PACU Anesthesia Type: General Level of consciousness: awake and alert Pain management: pain level controlled Vital Signs Assessment: post-procedure vital signs reviewed and stable Respiratory status: spontaneous breathing, nonlabored ventilation, respiratory function stable and patient connected to nasal cannula oxygen Cardiovascular status: blood pressure returned to baseline and stable Postop Assessment: no apparent nausea or vomiting Anesthetic complications: no  No notable events documented.   Last Vitals:  Vitals:   04/05/23 1057 04/05/23 1256  BP: (!) 138/106 (!) 142/89  Pulse: 73 68  Resp: 20 18  Temp: (!) 35.7 C (!) 36.1 C  SpO2: 100%     Last Pain:  Vitals:   04/05/23 1256  TempSrc: Temporal  PainSc: Asleep                 Stephanie Coup

## 2023-04-05 NOTE — H&P (Signed)
Pre-Procedure H&P   Patient ID: Vickie Gutierrez is a 57 y.o. female.  Gastroenterology Provider: Jaynie Collins, DO  Referring Provider: Dr. Larwance Sachs PCP: Reubin Milan, MD  Date: 04/05/2023  HPI Ms. Vickie Gutierrez is a 57 y.o. female who presents today for Colonoscopy for colorectal cancer screening   Initial screening colonoscopy.  No family history of colon cancer or colon polyps.  Daily bowel movement without melena or hematochezia.  Hemoglobin 13.7 MCV 87.8 platelets 241,000   Past Medical History:  Diagnosis Date   Allergy    seasonal    Past Surgical History:  Procedure Laterality Date   carpel tunnel     TUBAL LIGATION      Family History No h/o GI disease or malignancy  Review of Systems  Constitutional:  Negative for activity change, appetite change, chills, diaphoresis, fatigue, fever and unexpected weight change.  HENT:  Negative for trouble swallowing and voice change.   Respiratory:  Negative for shortness of breath and wheezing.   Cardiovascular:  Negative for chest pain, palpitations and leg swelling.  Gastrointestinal:  Negative for abdominal distention, abdominal pain, anal bleeding, blood in stool, constipation, diarrhea, nausea, rectal pain and vomiting.  Musculoskeletal:  Negative for arthralgias and myalgias.  Skin:  Negative for color change and pallor.  Neurological:  Negative for dizziness, syncope and weakness.  Psychiatric/Behavioral:  Negative for confusion.   All other systems reviewed and are negative.    Medications No current facility-administered medications on file prior to encounter.   Current Outpatient Medications on File Prior to Encounter  Medication Sig Dispense Refill   citalopram (CELEXA) 20 MG tablet Take 1 tablet (20 mg total) by mouth daily. 30 tablet 1    Pertinent medications related to GI and procedure were reviewed by me with the patient prior to the procedure   Current Facility-Administered  Medications:    0.9 %  sodium chloride infusion, , Intravenous, Continuous, Jaynie Collins, DO, Last Rate: 20 mL/hr at 04/05/23 1111, 20 mL/hr at 04/05/23 1111  sodium chloride 20 mL/hr (04/05/23 1111)       No Known Allergies Allergies were reviewed by me prior to the procedure  Objective   Body mass index is 43.97 kg/m. Vitals:   04/05/23 1057  BP: (!) 138/106  Pulse: 73  Resp: 20  Temp: (!) 96.3 F (35.7 C)  TempSrc: Temporal  SpO2: 100%  Weight: 119.8 kg  Height: 5\' 5"  (1.651 m)     Physical Exam Vitals and nursing note reviewed.  Constitutional:      General: She is not in acute distress.    Appearance: Normal appearance. She is obese. She is not ill-appearing, toxic-appearing or diaphoretic.  HENT:     Head: Normocephalic and atraumatic.     Nose: Nose normal.     Mouth/Throat:     Mouth: Mucous membranes are moist.     Pharynx: Oropharynx is clear.  Eyes:     General: No scleral icterus.    Extraocular Movements: Extraocular movements intact.  Cardiovascular:     Rate and Rhythm: Normal rate and regular rhythm.     Heart sounds: Normal heart sounds. No murmur heard.    No friction rub. No gallop.  Pulmonary:     Effort: Pulmonary effort is normal. No respiratory distress.     Breath sounds: Normal breath sounds. No wheezing, rhonchi or rales.  Abdominal:     General: Bowel sounds are normal. There is no  distension.     Palpations: Abdomen is soft.     Tenderness: There is no abdominal tenderness. There is no guarding or rebound.  Musculoskeletal:     Cervical back: Neck supple.     Right lower leg: No edema.     Left lower leg: No edema.  Skin:    General: Skin is warm and dry.     Coloration: Skin is not jaundiced or pale.  Neurological:     General: No focal deficit present.     Mental Status: She is alert and oriented to person, place, and time. Mental status is at baseline.  Psychiatric:        Mood and Affect: Mood normal.         Behavior: Behavior normal.        Thought Content: Thought content normal.        Judgment: Judgment normal.      Assessment:  Ms. Vickie Gutierrez is a 57 y.o. female  who presents today for Colonoscopy for colorectal cancer screening .  Plan:  Colonoscopy with possible intervention today  Colonoscopy with possible biopsy, control of bleeding, polypectomy, and interventions as necessary has been discussed with the patient/patient representative. Informed consent was obtained from the patient/patient representative after explaining the indication, nature, and risks of the procedure including but not limited to death, bleeding, perforation, missed neoplasm/lesions, cardiorespiratory compromise, and reaction to medications. Opportunity for questions was given and appropriate answers were provided. Patient/patient representative has verbalized understanding is amenable to undergoing the procedure.   Jaynie Collins, DO  [facility]  Portions of the record may have been created with voice recognition software. Occasional wrong-word or 'sound-a-like' substitutions may have occurred due to the inherent limitations of voice recognition software.  Read the chart carefully and recognize, using context, where substitutions may have occurred.

## 2023-04-05 NOTE — Transfer of Care (Signed)
Immediate Anesthesia Transfer of Care Note  Patient: Vickie Gutierrez  Procedure(s) Performed: COLONOSCOPY WITH PROPOFOL  Patient Location: PACU  Anesthesia Type:General  Level of Consciousness: drowsy  Airway & Oxygen Therapy: Patient Spontanous Breathing  Post-op Assessment: Report given to RN and Post -op Vital signs reviewed and stable  Post vital signs: Reviewed and stable  Last Vitals:  Vitals Value Taken Time  BP 142/89 04/05/23 1259  Temp    Pulse 71 04/05/23 1300  Resp 21 04/05/23 1300  SpO2 99 % 04/05/23 1300  Vitals shown include unvalidated device data.  Last Pain:  Vitals:   04/05/23 1057  TempSrc: Temporal  PainSc: 0-No pain         Complications: No notable events documented.

## 2023-04-05 NOTE — Op Note (Signed)
[facility] Gastroenterology Patient Name: Vickie Gutierrez Procedure Date: 04/05/2023 11:38 AM MRN: [id number] Account #: [id number] Date of Birth: [birth year] Admit Type: Outpatient Age: 57 Room: [facility] ENDO ROOM 1 Gender: Female Note Status: Finalized Instrument Name: Colonoscope [id number] Procedure:             Colonoscopy Indications:           Screening for colorectal malignant neoplasm Providers:             Trenda Moots, DO Referring MD:          Bari Edward, MD (Referring MD) Medicines:             Monitored Anesthesia Care Complications:         No immediate complications. Estimated blood loss:                         Minimal. Procedure:             Pre-Anesthesia Assessment:                        - Prior to the procedure, a History and Physical was                         performed, and patient medications and allergies were                         reviewed. The patient is competent. The risks and                         benefits of the procedure and the sedation options and                         risks were discussed with the patient. All questions                         were answered and informed consent was obtained.                         Patient identification and proposed procedure were                         verified by the physician, the nurse, the anesthetist                         and the technician in the endoscopy suite. Mental                         Status Examination: alert and oriented. Airway                         Examination: normal oropharyngeal airway and neck                         mobility. Respiratory Examination: clear to                         auscultation. CV Examination: RRR, no murmurs, no S3  or S4. Prophylactic Antibiotics: The patient does not                         require prophylactic antibiotics. Prior                         Anticoagulants: The patient has taken no anticoagulant                          or antiplatelet agents. ASA Grade Assessment: III - A                         patient with severe systemic disease. After reviewing                         the risks and benefits, the patient was deemed in                         satisfactory condition to undergo the procedure. The                         anesthesia plan was to use monitored anesthesia care                         (MAC). Immediately prior to administration of                         medications, the patient was re-assessed for adequacy                         to receive sedatives. The heart rate, respiratory                         rate, oxygen saturations, blood pressure, adequacy of                         pulmonary ventilation, and response to care were                         monitored throughout the procedure. The physical                         status of the patient was re-assessed after the                         procedure.                        After obtaining informed consent, the colonoscope was                         passed under direct vision. Throughout the procedure,                         the patient's blood pressure, pulse, and oxygen                         saturations were monitored continuously. The  Colonoscope was introduced through the anus and                         advanced to the the terminal ileum, with                         identification of the appendiceal orifice and IC                         valve. The colonoscopy was performed without                         difficulty. The patient tolerated the procedure well.                         The quality of the bowel preparation was evaluated                         using the BBPS [facility] Bowel Preparation Scale) with                         scores of: Right Colon = 3, Transverse Colon = 3 and                         Left Colon = 3 (entire mucosa seen well with no                          residual staining, small fragments of stool or opaque                         liquid). The total BBPS score equals 9. The terminal                         ileum, ileocecal valve, appendiceal orifice, and                         rectum were photographed. Findings:      The perianal and digital rectal examinations were normal. Pertinent       negatives include normal sphincter tone.      The terminal ileum appeared normal. Estimated blood loss: none.      Three sessile polyps were found in the transverse colon (2) and cecum       (1). The polyps were 1 to 2 mm in size. These polyps were removed with a       jumbo cold forceps. Resection and retrieval were complete. Estimated       blood loss was minimal.      A 3 to 4 mm polyp was found in the sigmoid colon. The polyp was sessile.       The polyp was removed with a cold snare. Resection and retrieval were       complete. Estimated blood loss was minimal.      The exam was otherwise without abnormality on direct and retroflexion       views. Impression:            - The examined portion of the ileum was normal.                        -  Three 1 to 2 mm polyps in the transverse colon and                         in the cecum, removed with a jumbo cold forceps.                         Resected and retrieved.                        - One 3 to 4 mm polyp in the sigmoid colon, removed                         with a cold snare. Resected and retrieved.                        - The examination was otherwise normal on direct and                         retroflexion views. Recommendation:        - Patient has a contact number available for                         emergencies. The signs and symptoms of potential                         delayed complications were discussed with the patient.                         Return to normal activities tomorrow. Written                         discharge instructions were provided to the patient.                         - Discharge patient to home.                        - Resume previous diet.                        - Continue present medications.                        - No ibuprofen, naproxen, or other non-steroidal                         anti-inflammatory drugs for 5 days after polyp removal.                        - Await pathology results.                        - Repeat colonoscopy for surveillance based on                         pathology results.                        - Return to referring physician as previously  scheduled.                        - The findings and recommendations were discussed with                         the patient. Procedure Code(s):     --- Professional ---                        [phone], Colonoscopy, flexible; with removal of                         tumor(s), polyp(s), or other lesion(s) by snare                         technique                        45380, 59, Colonoscopy, flexible; with biopsy, single                         or multiple Diagnosis Code(s):     --- Professional ---                        Z12.11, Encounter for screening for malignant neoplasm                         of colon                        D12.3, Benign neoplasm of transverse colon (hepatic                         flexure or splenic flexure)                        D12.0, Benign neoplasm of cecum                        D12.5, Benign neoplasm of sigmoid colon CPT copyright 2022 American Medical Association. All rights reserved. The codes documented in this report are preliminary and upon coder review may  be revised to meet current compliance requirements. Attending Participation:      I personally performed the entire procedure. Elfredia Nevins, DO Jaynie Collins DO, DO 04/05/2023 12:57:01 PM This report has been signed electronically. Number of Addenda: 0 Note Initiated On: 04/05/2023 11:38 AM Scope Withdrawal Time: 0 hours 15 minutes 2 seconds  Total  Procedure Duration: 0 hours 22 minutes 24 seconds  Estimated Blood Loss:  Estimated blood loss was minimal.      [facility]

## 2023-04-05 NOTE — Interval H&P Note (Signed)
History and Physical Interval Note: Preprocedure H&P from 04/05/23  was reviewed and there was no interval change after seeing and examining the patient.  Written consent was obtained from the patient after discussion of risks, benefits, and alternatives. Patient has consented to proceed with Colonoscopy with possible intervention   04/05/2023 11:48 AM  Vickie Gutierrez  has presented today for surgery, with the diagnosis of Z12.11 (ICD-10-CM) - Colon cancer screening.  The various methods of treatment have been discussed with the patient and family. After consideration of risks, benefits and other options for treatment, the patient has consented to  Procedure(s): COLONOSCOPY WITH PROPOFOL (N/A) as a surgical intervention.  The patient's history has been reviewed, patient examined, no change in status, stable for surgery.  I have reviewed the patient's chart and labs.  Questions were answered to the patient's satisfaction.     Jaynie Collins

## 2023-04-08 ENCOUNTER — Encounter: Payer: Self-pay | Admitting: Gastroenterology

## 2023-05-31 IMAGING — MG MM DIGITAL SCREENING BILAT W/ TOMO AND CAD
8 series · 8 of 24 positions shown · non-contrast
Comparison: Previous exam(s).

CLINICAL DATA: Screening.

EXAM:
DIGITAL SCREENING BILATERAL MAMMOGRAM WITH TOMOSYNTHESIS AND CAD
TECHNIQUE: Bilateral screening digital craniocaudal and mediolateral oblique
mammograms were obtained. Bilateral screening digital breast
tomosynthesis was performed. The images were evaluated with
computer-aided detection.

[R CC synth-2D]
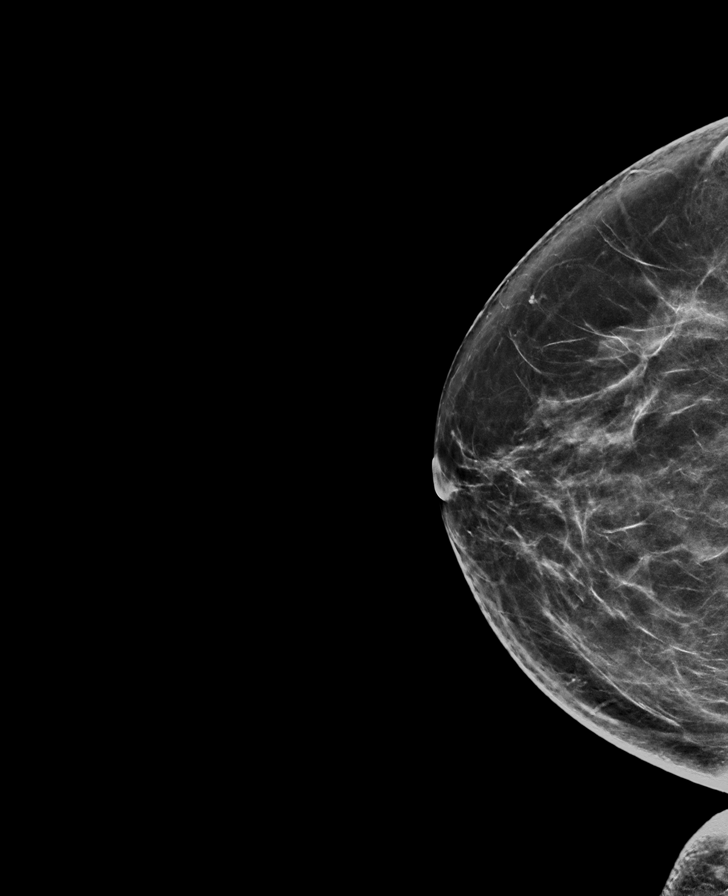

[R MLO synth-2D]
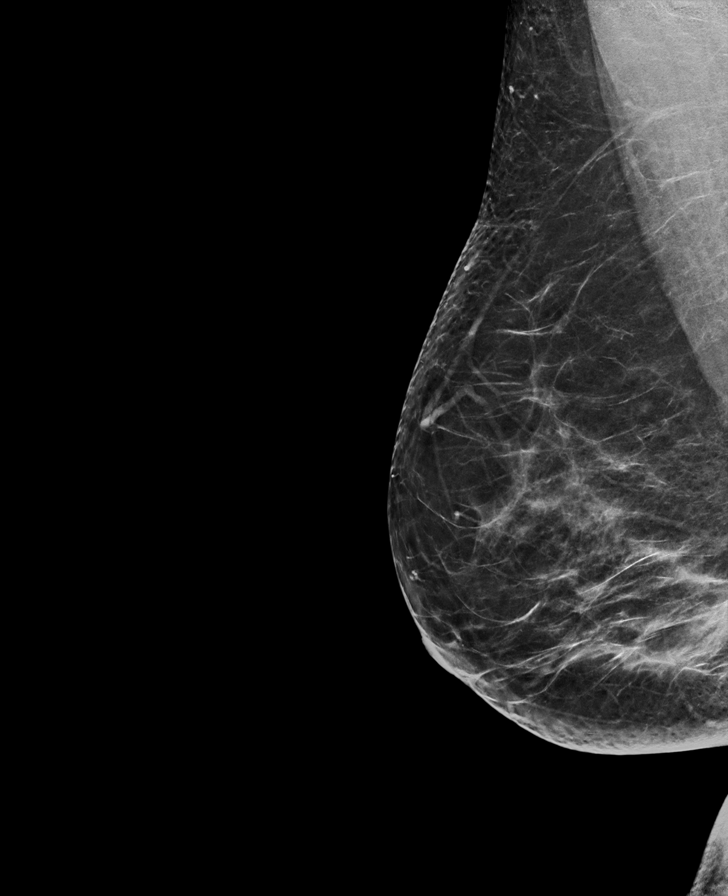

[L MLO synth-2D]
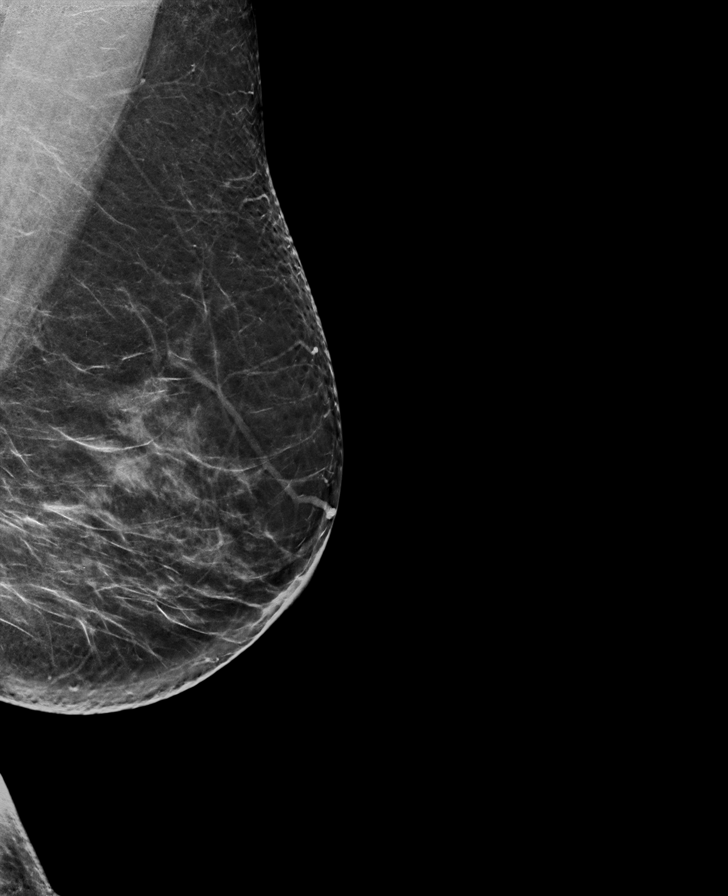

[L CC synth-2D]
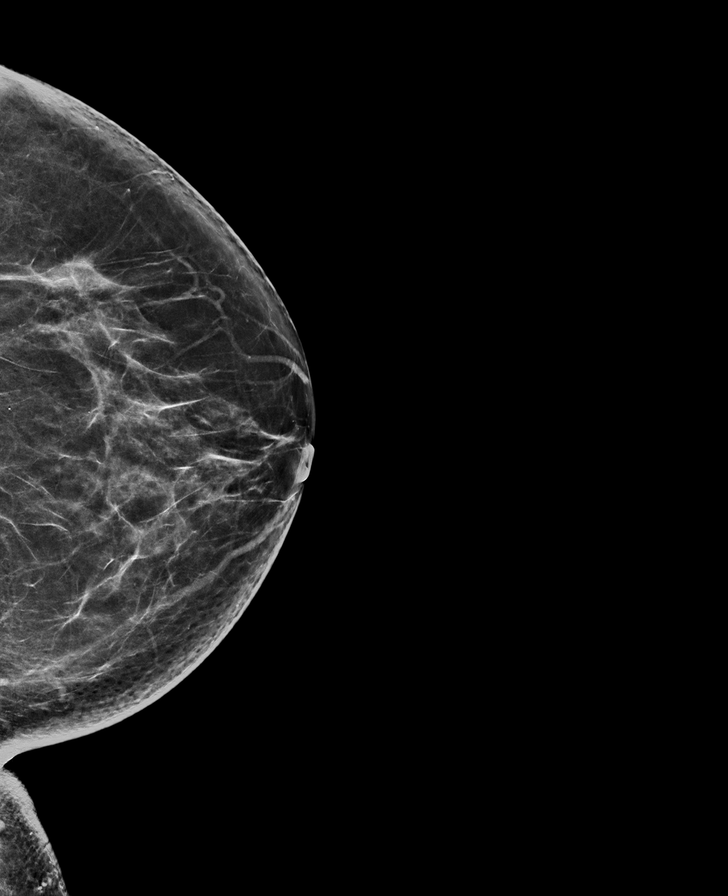

[R CC tomo · tomo slice 38/75.0]
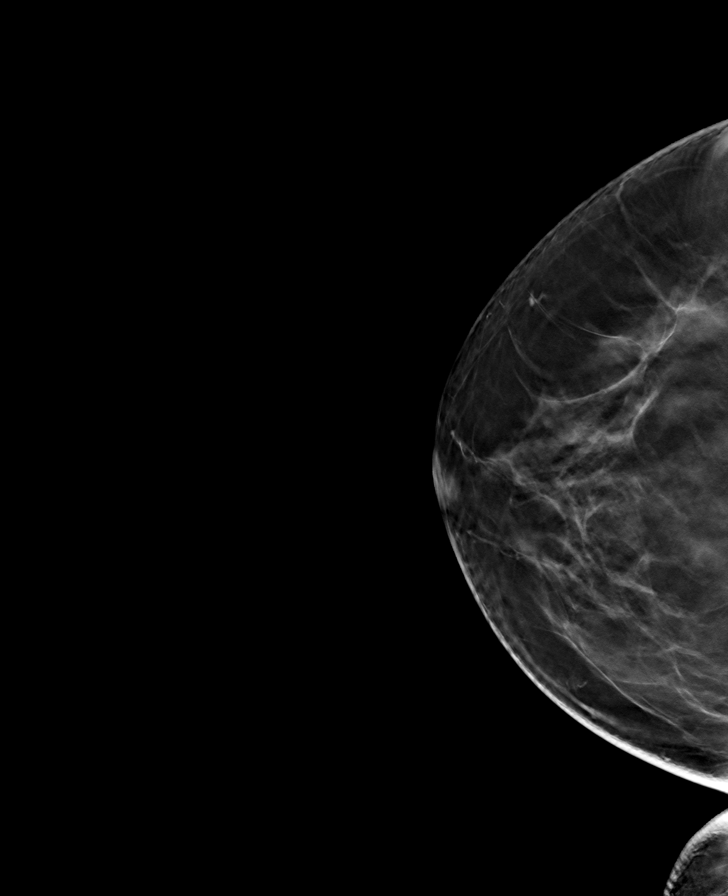

[R MLO tomo · tomo slice 41/80.0]
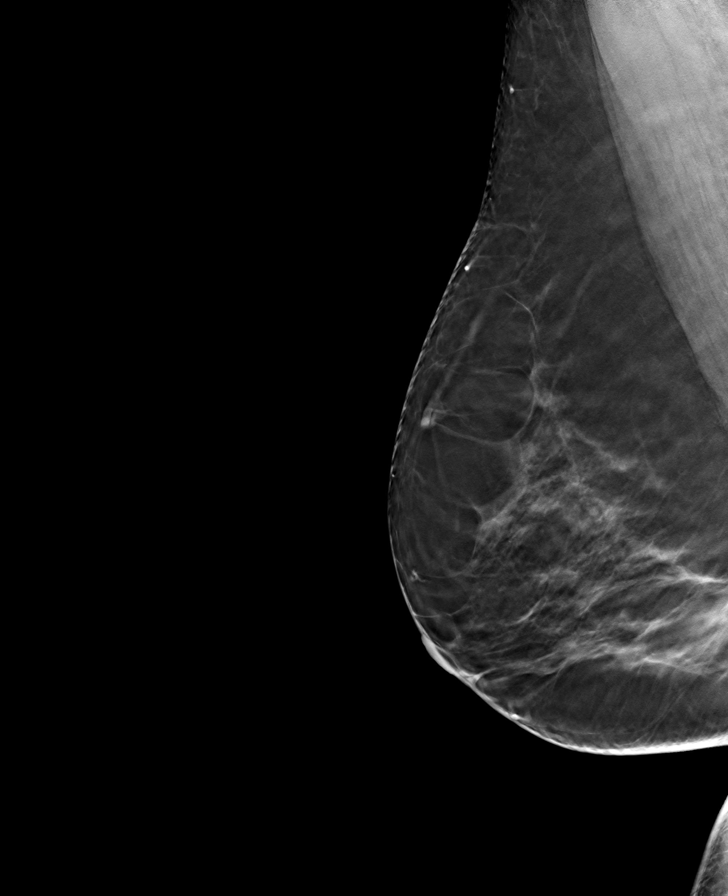

[L CC tomo · tomo slice 38/75.0]
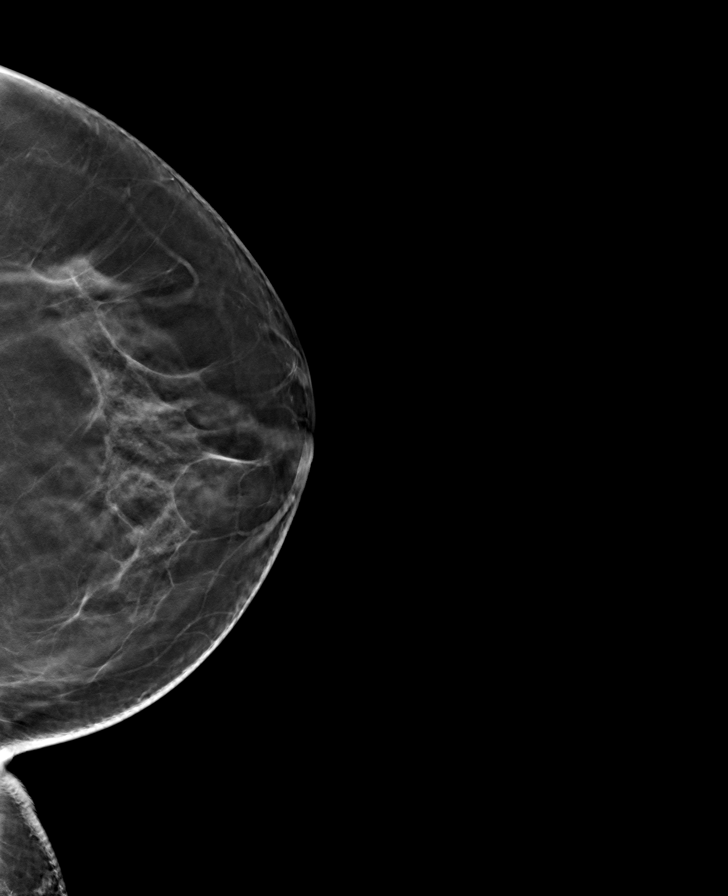

[L MLO tomo · tomo slice 39/77.0]
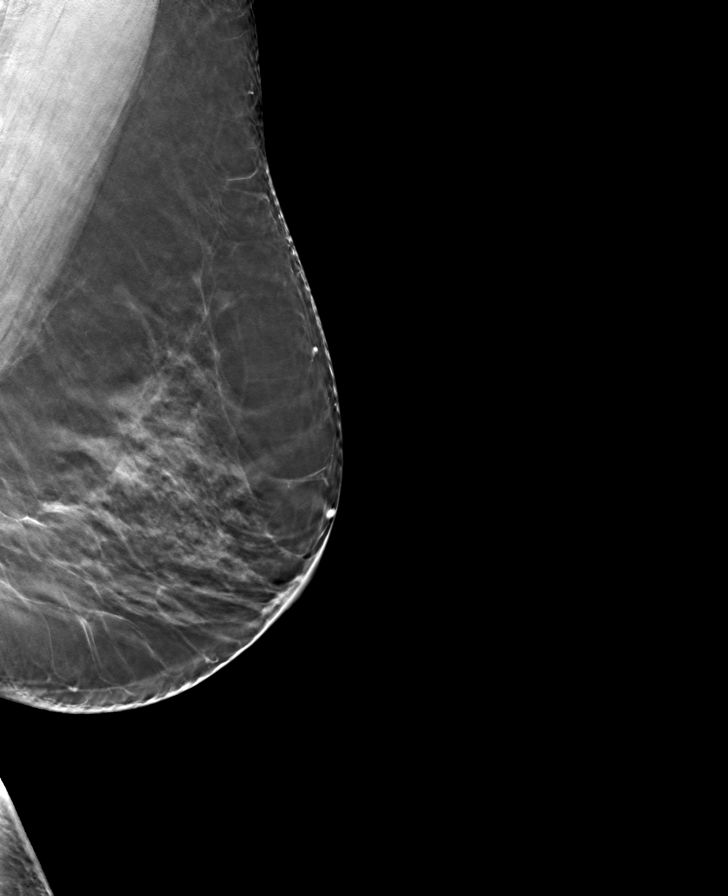

[8 of 24 positions shown; findings below may reference images not displayed]

ACR Breast Density Category b: There are scattered areas of
fibroglandular density.
FINDINGS: There are no findings suspicious for malignancy.
IMPRESSION: No mammographic evidence of malignancy. A result letter of this
screening mammogram will be mailed directly to the patient.

RECOMMENDATION:
Screening mammogram in one year. (Code:51-O-LD2)

BI-RADS CATEGORY  1: Negative.

## 2023-10-03 DIAGNOSIS — I10 Essential (primary) hypertension: Secondary | ICD-10-CM | POA: Diagnosis not present

## 2023-10-03 DIAGNOSIS — Z13220 Encounter for screening for lipoid disorders: Secondary | ICD-10-CM | POA: Diagnosis not present

## 2023-10-03 DIAGNOSIS — Z0000 Encounter for general adult medical examination without abnormal findings: Secondary | ICD-10-CM | POA: Diagnosis not present

## 2023-10-03 DIAGNOSIS — Z131 Encounter for screening for diabetes mellitus: Secondary | ICD-10-CM | POA: Diagnosis not present

## 2024-05-26 ENCOUNTER — Encounter: Payer: Self-pay | Admitting: Family Medicine

## 2024-08-17 ENCOUNTER — Encounter: Payer: Self-pay | Admitting: Family Medicine

## 2024-08-17 ENCOUNTER — Ambulatory Visit
Admission: RE | Admit: 2024-08-17 | Discharge: 2024-08-17 | Disposition: A | Source: Ambulatory Visit | Attending: Family Medicine

## 2024-08-17 ENCOUNTER — Ambulatory Visit: Admitting: Family Medicine

## 2024-08-17 ENCOUNTER — Ambulatory Visit
Admission: RE | Admit: 2024-08-17 | Discharge: 2024-08-17 | Disposition: A | Discharge status: Home / Self Care | Attending: Family Medicine | Admitting: Family Medicine

## 2024-08-17 VITALS — BP 140/76 | HR 76 | Resp 16 | Ht 65.0 in | Wt 273.0 lb

## 2024-08-17 DIAGNOSIS — E66813 Obesity, class 3: Secondary | ICD-10-CM | POA: Diagnosis not present

## 2024-08-17 DIAGNOSIS — G43709 Chronic migraine without aura, not intractable, without status migrainosus: Secondary | ICD-10-CM | POA: Diagnosis not present

## 2024-08-17 DIAGNOSIS — Z1231 Encounter for screening mammogram for malignant neoplasm of breast: Secondary | ICD-10-CM

## 2024-08-17 DIAGNOSIS — G8929 Other chronic pain: Secondary | ICD-10-CM | POA: Diagnosis not present

## 2024-08-17 DIAGNOSIS — M25561 Pain in right knee: Secondary | ICD-10-CM | POA: Insufficient documentation

## 2024-08-17 DIAGNOSIS — Z6841 Body Mass Index (BMI) 40.0 and over, adult: Secondary | ICD-10-CM

## 2024-08-17 DIAGNOSIS — M2351 Chronic instability of knee, right knee: Secondary | ICD-10-CM

## 2024-08-17 DIAGNOSIS — R030 Elevated blood-pressure reading, without diagnosis of hypertension: Secondary | ICD-10-CM

## 2024-08-17 DIAGNOSIS — L650 Telogen effluvium: Secondary | ICD-10-CM

## 2024-08-17 DIAGNOSIS — L299 Pruritus, unspecified: Secondary | ICD-10-CM

## 2024-08-17 MED ORDER — MELOXICAM 15 MG PO TABS: 15.0000 mg | ORAL_TABLET | Freq: Every day | ORAL | 0 refills | Status: DC

## 2024-08-17 MED ORDER — METFORMIN HCL ER 500 MG PO TB24
500.0000 mg | ORAL_TABLET | Freq: Every day | ORAL | 1 refills | Status: AC
Start: 2024-08-17 — End: ?

## 2024-08-17 MED ORDER — SUMATRIPTAN SUCCINATE 25 MG PO TABS: 25.0000 mg | ORAL_TABLET | ORAL | 0 refills | Status: DC | PRN
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
## 2024-08-17 NOTE — Patient Instructions (Addendum)
 Please report to [facility] located at:  [address]  [place], [place] [address]  You do not need an appointment to have xrays completed.   Our office will follow up with  results once available.    [facility] [address] [place],  [place]  [address] Main: [phone]   You can visit the lab without an appointment Mon-Fri between 8A-11:30A or 1P-4:30P.    Please aim for blood pressures in the 120s/70s.   Please check blood pressure daily after sitting for 5  mins quietly with your back supported  To keep you healthy, please keep in mind the following health maintenance items that you are due for:   Health Maintenance Due  Topic Date Due   Hepatitis C Screening  Never done   DTaP/Tdap/Td (1 - Tdap) Never done   Hepatitis B Vaccines 19-59 Average Risk (1 of 3 - 19+ 3-dose series) Never done   Pneumococcal Vaccine: 50+ Years (1 of 1 - PCV) Never done   Zoster Vaccines- Shingrix (1 of 2) Never done   Cervical Cancer Screening (HPV/Pap Cotest)  05/09/2022   Mammogram  11/15/2022   Influenza Vaccine  05/22/2024   COVID-19 Vaccine (1 - 2025-26 season) Never done     Best Wishes,   Dr. Lang

## 2024-08-18 NOTE — Assessment & Plan Note (Signed)
 Elevated Blood Pressure Review of flow sheets consistent with elevated blood pressures however, she denies being treated for HTN in the past, unsure if these past readings were during acute/emergent visits so will have patient monitor  Blood pressure elevated at 140/81 with a history of elevated readings. Emphasized the need for daily monitoring to understand patterns  - Instruct to monitor blood pressure daily at home - Recheck blood pressure at follow-up - Discuss potential need for antihypertensive medication if blood pressure remains elevated

## 2024-08-18 NOTE — Assessment & Plan Note (Signed)
 Class 3 Obesity (BMI 45.43) Chronic condition with BMI of 45.43. Discussed weight loss options including lifestyle changes, medications, and surgical interventions. She is not interested in injectable medications and prefers non-surgical options. Discussed metformin for weight management, noting potential 3-5% weight loss. Discussed bariatric surgery as an option with potential 45% weight loss, requiring referral and multidisciplinary team evaluation. - Prescribe metformin 500 mg daily for weight management - Discuss potential referral for bariatric surgery

## 2024-08-18 NOTE — Assessment & Plan Note (Signed)
 Chronic Migraine without Aura Chronic migraines occurring every other day. Previously managed with ibuprofen and BC powder. Discussed abortive treatment options with Imitrex, emphasizing it should not be used daily or multiple times a week. - Prescribe Imitrex 25 mg, take at onset of headache, may repeat in 2 hours if needed, no more than twice a day
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
## 2024-08-19 ENCOUNTER — Ambulatory Visit: Payer: Self-pay | Admitting: Family Medicine

## 2024-08-20 NOTE — Telephone Encounter (Signed)
 Last read by Zsa Zsa M Flam Monique at 3:07PM on 08/19/2024.

## 2024-08-24 ENCOUNTER — Ambulatory Visit
Admission: RE | Admit: 2024-08-24 | Discharge: 2024-08-24 | Disposition: A | Discharge status: Home / Self Care | Source: Ambulatory Visit | Attending: Family Medicine | Admitting: Family Medicine

## 2024-08-24 DIAGNOSIS — L299 Pruritus, unspecified: Secondary | ICD-10-CM | POA: Insufficient documentation

## 2024-08-24 DIAGNOSIS — K118 Other diseases of salivary glands: Secondary | ICD-10-CM | POA: Diagnosis not present

## 2024-08-27 ENCOUNTER — Ambulatory Visit
Admission: RE | Admit: 2024-08-27 | Discharge: 2024-08-27 | Disposition: A | Discharge status: Home / Self Care | Source: Ambulatory Visit | Attending: Family Medicine | Admitting: Family Medicine

## 2024-08-27 DIAGNOSIS — Z1231 Encounter for screening mammogram for malignant neoplasm of breast: Secondary | ICD-10-CM | POA: Insufficient documentation

## 2024-09-01 ENCOUNTER — Other Ambulatory Visit: Payer: Self-pay | Admitting: Family Medicine

## 2024-09-01 DIAGNOSIS — R928 Other abnormal and inconclusive findings on diagnostic imaging of breast: Secondary | ICD-10-CM

## 2024-09-02 ENCOUNTER — Other Ambulatory Visit: Payer: Self-pay | Admitting: Family Medicine

## 2024-09-02 ENCOUNTER — Ambulatory Visit
Admission: RE | Admit: 2024-09-02 | Discharge: 2024-09-02 | Disposition: A | Discharge status: Home / Self Care | Source: Ambulatory Visit | Attending: Family Medicine | Admitting: Family Medicine

## 2024-09-02 DIAGNOSIS — R928 Other abnormal and inconclusive findings on diagnostic imaging of breast: Secondary | ICD-10-CM | POA: Diagnosis not present

## 2024-09-02 DIAGNOSIS — R92333 Mammographic heterogeneous density, bilateral breasts: Secondary | ICD-10-CM | POA: Diagnosis not present

## 2024-09-02 DIAGNOSIS — N6321 Unspecified lump in the left breast, upper outer quadrant: Secondary | ICD-10-CM | POA: Diagnosis not present

## 2024-09-03 ENCOUNTER — Ambulatory Visit
Admission: RE | Admit: 2024-09-03 | Discharge: 2024-09-03 | Disposition: A | Discharge status: Home / Self Care | Source: Ambulatory Visit | Attending: Family Medicine | Admitting: Family Medicine

## 2024-09-03 ENCOUNTER — Ambulatory Visit
Admission: RE | Admit: 2024-09-03 | Discharge: 2024-09-03 | Disposition: A | Source: Ambulatory Visit | Attending: Family Medicine

## 2024-09-03 DIAGNOSIS — R928 Other abnormal and inconclusive findings on diagnostic imaging of breast: Secondary | ICD-10-CM | POA: Diagnosis not present

## 2024-09-03 DIAGNOSIS — C50412 Malignant neoplasm of upper-outer quadrant of left female breast: Secondary | ICD-10-CM | POA: Diagnosis not present

## 2024-09-03 DIAGNOSIS — N6321 Unspecified lump in the left breast, upper outer quadrant: Secondary | ICD-10-CM | POA: Diagnosis not present

## 2024-09-03 HISTORY — PX: BREAST BIOPSY: SHX20

## 2024-09-03 MED ORDER — LIDOCAINE-EPINEPHRINE 1 %-1:100000 IJ SOLN
20.0000 mL | Freq: Once | INTRAMUSCULAR | Status: AC
  Administered 2024-09-03: 20 mL
  Filled 2024-09-03: qty 20

## 2024-09-03 MED ORDER — LIDOCAINE 1 % OPTIME INJ - NO CHARGE
5.0000 mL | Freq: Once | INTRAMUSCULAR | Status: AC
  Administered 2024-09-03: 5 mL
  Filled 2024-09-03: qty 6

## 2024-09-03 MED ORDER — LIDOCAINE-EPINEPHRINE 1 %-1:100000 IJ SOLN
5.0000 mL | Freq: Once | INTRAMUSCULAR | Status: AC
  Administered 2024-09-03: 5 mL
  Filled 2024-09-03: qty 5

## 2024-09-03 MED ORDER — LIDOCAINE 1 % OPTIME INJ - NO CHARGE
1.0000 mL | Freq: Once | INTRAMUSCULAR | Status: AC
  Administered 2024-09-03: 1 mL
  Filled 2024-09-03: qty 2

## 2024-09-04 LAB — SURGICAL PATHOLOGY

## 2024-09-07 ENCOUNTER — Encounter: Payer: Self-pay | Admitting: *Deleted

## 2024-09-07 DIAGNOSIS — C50919 Malignant neoplasm of unspecified site of unspecified female breast: Secondary | ICD-10-CM

## 2024-09-07 NOTE — Progress Notes (Signed)
 Received referral for newly diagnosed breast cancer from [facility].  Navigation initiated.  Vickie Gutierrez will see Dr. Jacobo on 11/19 at 2.   Referral has been sent to [facility] and their office will call her with the appt.

## 2024-09-09 ENCOUNTER — Inpatient Hospital Stay

## 2024-09-09 ENCOUNTER — Encounter: Payer: Self-pay | Admitting: Oncology

## 2024-09-09 ENCOUNTER — Inpatient Hospital Stay: Attending: Oncology | Admitting: Oncology

## 2024-09-09 ENCOUNTER — Encounter: Payer: Self-pay | Admitting: *Deleted

## 2024-09-09 VITALS — BP 155/84 | HR 69 | Temp 97.0°F | Resp 18 | Ht 65.0 in | Wt 275.0 lb

## 2024-09-09 DIAGNOSIS — Z9189 Other specified personal risk factors, not elsewhere classified: Secondary | ICD-10-CM

## 2024-09-09 DIAGNOSIS — C50912 Malignant neoplasm of unspecified site of left female breast: Secondary | ICD-10-CM

## 2024-09-09 DIAGNOSIS — Z170 Estrogen receptor positive status [ER+]: Secondary | ICD-10-CM | POA: Diagnosis not present

## 2024-09-09 DIAGNOSIS — C50919 Malignant neoplasm of unspecified site of unspecified female breast: Secondary | ICD-10-CM

## 2024-09-09 DIAGNOSIS — I10 Essential (primary) hypertension: Secondary | ICD-10-CM

## 2024-09-09 NOTE — Progress Notes (Signed)
 Accompanied patient and family to initial medical oncology appointment.   Reviewed Breast Cancer treatment handbook.   Care plan summary given to patient.   Reviewed outreach programs and cancer center services.

## 2024-09-09 NOTE — Progress Notes (Signed)
 Patient is doing ok, just anxious.
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
## 2024-09-14 ENCOUNTER — Other Ambulatory Visit: Payer: Self-pay | Admitting: Family Medicine

## 2024-09-14 DIAGNOSIS — M25561 Pain in right knee: Secondary | ICD-10-CM

## 2024-09-21 ENCOUNTER — Ambulatory Visit: Payer: Self-pay | Admitting: Surgery

## 2024-09-21 ENCOUNTER — Encounter: Payer: Self-pay | Admitting: Surgery

## 2024-09-21 ENCOUNTER — Other Ambulatory Visit: Payer: Self-pay | Admitting: Surgery

## 2024-09-21 VITALS — BP 142/93 | HR 71 | Temp 98.2°F | Ht 65.0 in | Wt 270.8 lb

## 2024-09-21 DIAGNOSIS — Z170 Estrogen receptor positive status [ER+]: Secondary | ICD-10-CM

## 2024-09-21 DIAGNOSIS — C50912 Malignant neoplasm of unspecified site of left female breast: Secondary | ICD-10-CM

## 2024-09-21 DIAGNOSIS — C50919 Malignant neoplasm of unspecified site of unspecified female breast: Secondary | ICD-10-CM

## 2024-09-21 DIAGNOSIS — C50412 Malignant neoplasm of upper-outer quadrant of left female breast: Secondary | ICD-10-CM

## 2024-09-21 NOTE — Progress Notes (Signed)
 09/21/2024  Reason for Visit:  Left breast cancer  Requesting Provider:  Rockie Agent, MD  History of Present Illness: Vickie Gutierrez is a 58 y.o. female presenting for evaluation of left breast cancer.  The patient had a mammogram on 08/27/2024 which showed a left breast area of asymmetry or possible mass.  She had a diagnostic mammogram and ultrasound on 09/02/2024 which showed a spiculated mass measuring 17 mm, at 2 o'clock position, 6 cm from nipple.  There was an additional 7 mm area of asymmetry posterior to this main mass.  She had biopsy of these 2 areas which showed invasive mammary carcinoma and invasive lobular carcinoma.  She has seen Dr. Jacobo on 09/09/2024 and now presents for discussion for surgery.  Overall the patient denies any family history of breast cancer.  She reports having 3 children at age 15 being her first pregnancy.  Her age of menarche was 75, she has not through menopause.  Has not had any prior biopsies.  She denies any palpable masses or pain in the left breast except some soreness after the biopsy itself.  Past Medical History: Past Medical History:  Diagnosis Date   Allergy    seasonal     Past Surgical History: Past Surgical History:  Procedure Laterality Date   BREAST BIOPSY Left 09/03/2024   US  LT BREAST BX W LOC DEV 1ST LESION IMG BX SPEC US  GUIDE 09/03/2024 [facility]   BREAST BIOPSY Left 09/03/2024   MM LT BREAST BX W LOC DEV 1ST LESION IMAGE BX SPEC STEREO GUIDE 09/03/2024 [facility]   carpel tunnel     COLONOSCOPY WITH PROPOFOL  N/A 04/05/2023   Procedure: COLONOSCOPY WITH PROPOFOL ;  Surgeon: Onita Elspeth Sharper, DO;  Location: [facility] ENDOSCOPY;  Service: Gastroenterology;  Laterality: N/A;   TUBAL LIGATION      Home Medications: Prior to Admission medications   Medication Sig Start Date End Date Taking? Authorizing Provider  meloxicam  (MOBIC ) 15 MG tablet TAKE 1 TABLET(15 MG) BY MOUTH DAILY 09/15/24  Yes  Simmons-Robinson, Makiera, MD  metFORMIN  (GLUCOPHAGE -XR) 500 MG 24 hr tablet Take 1 tablet (500 mg total) by mouth daily with breakfast. 08/17/24  Yes Simmons-Robinson, Makiera, MD  SUMAtriptan  (IMITREX ) 25 MG tablet Take 1 tablet (25 mg total) by mouth every 2 (two) hours as needed for migraine. May repeat in 2 hours if headache persists or recurs. 08/17/24  Yes Simmons-Robinson, Makiera, MD    Allergies: No Known Allergies  Social History:  reports that she has never smoked. She has never used smokeless tobacco. She reports that she does not drink alcohol and does not use drugs.   Family History: Family History  Problem Relation Age of Onset   Ovarian cancer Mother    Diabetes Mother    Hypertension Mother    Diabetes Maternal Grandmother    Hypertension Maternal Grandmother    Breast cancer Neg Hx     Review of Systems: Review of Systems  Constitutional:  Negative for chills and fever.  HENT:  Negative for hearing loss.   Respiratory:  Negative for shortness of breath.   Cardiovascular:  Negative for chest pain.  Gastrointestinal:  Negative for nausea and vomiting.  Genitourinary:  Negative for dysuria.  Musculoskeletal:  Negative for myalgias.  Skin:  Negative for rash.  Neurological:  Negative for dizziness.  Psychiatric/Behavioral:  Negative for depression.     Physical Exam BP (!) 142/93   Pulse 71   Temp 98.2 F (36.8 C) (Oral)   Ht  5' 5 (1.651 m)   Wt 270 lb 12.8 oz (122.8 kg)   LMP 10/22/2012 (Approximate) Comment: no periods.   SpO2 98%   BMI 45.06 kg/m  CONSTITUTIONAL: No acute distress HEENT:  Normocephalic, atraumatic, extraocular motion intact. NECK: Trachea is midline, and there is no jugular venous distension.  RESPIRATORY:  Lungs are clear, and breath sounds are equal bilaterally. Normal respiratory effort without pathologic use of accessory muscles. CARDIOVASCULAR: Heart is regular without murmurs, gallops, or rubs. BREAST: Left breast without  any palpable masses, skin changes, or nipple changes.  Biopsy site is well-healed and there is only some palpable inflammation at the site of biopsy.  No left axillary lymphadenopathy.  Right breast without any palpable masses, skin changes, nipple changes.  No right axillary lymphadenopathy. MUSCULOSKELETAL:  Normal muscle strength and tone in all four extremities.  No peripheral edema or cyanosis. SKIN: Skin turgor is normal. There are no pathologic skin lesions.  NEUROLOGIC:  Motor and sensation is grossly normal.  Cranial nerves are grossly intact. PSYCH:  Alert and oriented to person, place and time. Affect is normal.  Laboratory Analysis: Left breast biopsy on 09/03/2024: FINAL DIAGNOSIS      1. Breast, left, needle core biopsy, 2:00 6cmfn 15mm (venus clip) :      INVASIVE MAMMARY CARCINOMA      MAMMARY CARCINOMA IN SITU, SOLID, INTERMEDIATE NUCLEAR GRADE      TUBULE FORMATION: SCORE 3      NUCLEAR PLEOMORPHISM: SCORE 2      MITOTIC COUNT: SCORE 1      TOTAL SCORE: 6      OVERALL GRADE: 2      LYMPHOVASCULAR INVASION: NOT IDENTIFIED      CANCER LENGTH: 13 MM / 1.3 CM      CALCIFICATIONS: NOT IDENTIFIED      OTHER FINDINGS: NONE       2. Breast, left, needle core biopsy, UOQ 7mm (coil clip) :      INVASIVE LOBULAR CARCINOMA      LOBULAR CARCINOMA IN SITU, INTERMEDIATE NUCLEAR GRADE      TUBULE FORMATION: SCORE 3      NUCLEAR PLEOMORPHISM: SCORE 2      MITOTIC COUNT: SCORE 1      TOTAL SCORE: 6      OVERALL GRADE: 2      LYMPHOVASCULAR INVASION: NOT IDENTIFIED      CANCER LENGTH: 7 MM / 0.7 CM      CALCIFICATIONS: NOT IDENTIFIED      OTHER FINDINGS: NONE   Imaging: Mammogram and ultrasound on 09/02/2024: FINDINGS: Full field lateral and spot compression view(s) were performed and demonstrate a spiculated mass with associated architectural distortion measuring approximately 17 mm in the LEFT breast 2 o'clock position. Posterior to this mass is a 7 mm asymmetry seen only on  the MLO view.   Targeted LEFT breast ultrasound demonstrates a 15 x 11 x 12 mm irregular spiculated hypoechoic mass with posterior shadowing at the 2 o'clock position 6 cm from the nipple. No correlate for the nearby asymmetry seen on mammography. LEFT axillary ultrasound was performed without suspicious lymphadenopathy.   IMPRESSION: Highly suspicious LEFT breast mass at 2 o'clock 4 cm from the nipple measuring 15 mm correlates with the mass described at the time of screening. Ultrasound-guided biopsy is recommended.   A nearby LEFT breast 7 mm asymmetry seen only on the MLO view and without sonographic correlate is also suspicious for malignancy and warrants further characterization with stereotactic  biopsy.   RECOMMENDATION: Ultrasound-guided biopsy of the LEFT breast x1. Stereotactic biopsy of the LEFT breast x1.   I have discussed the findings and recommendations with the patient. The biopsy procedure was discussed with the patient and questions were answered. Patient expressed their understanding of the biopsy recommendation. Patient will be scheduled for biopsy at her earliest convenience by the schedulers. Ordering provider will be notified. If applicable, a reminder letter will be sent to the patient regarding the next appointment.   BI-RADS CATEGORY  5: Highly suggestive of malignancy.  Assessment and Plan: This is a 58 y.o. female with left breast cancer.  -Discussed with the patient the findings on her imaging and biopsy results.  She has two areas within the upper outer quadrant of the left breast which are positive for cancer.  Discussed with the patient some of the risk factors associated with breast cancer.  Discussed with her the potential surgical options including lumpectomy versus mastectomy, with evaluation of the left axillary lymph nodes via sentinel lymph node biopsy.  Discussed with her the recommendation for radiation if we do lumpectomy versus likely no radiation if we  do mastectomy.  Discussed with her that lumpectomy or mastectomy would not affect the need for doing a sentinel lymph node biopsy as this is done for staging purposes given her invasive cancer.  After further discussion, the patient has opted to proceed with lumpectomy and sentinel lymph node biopsy, followed by radiation. -Discussed with patient and the plan for a left breast tag localized lumpectomy of 2 sites with a left axillary sentinel lymph node biopsy.  Reviewed with her the surgery at length including preoperative localization at the [facility] breast center, planned incisions, risks of bleeding, infection, injury to surrounding structures, lymphedema, that this would be an outpatient procedure, use of radioactive tracer to identify the sentinel lymph nodes as well as the use of a blue dye to identify the sentinel lymph nodes, postoperative activity restrictions, pain control, and she is willing to proceed. -Patient will be scheduled for surgery on 10/01/2024.  All of her questions have been answered.  I spent 55 minutes dedicated to the care of this patient on the date of this encounter to include pre-visit review of records, face-to-face time with the patient discussing diagnosis and management, and any post-visit coordination of care.   Aloysius Sheree Plant, MD [facility]

## 2024-09-21 NOTE — H&P (View-Only) (Signed)
 09/21/2024  Reason for Visit:  Left breast cancer  Requesting Provider:  Rockie Agent, MD  History of Present Illness: Vickie Gutierrez is a 58 y.o. female presenting for evaluation of left breast cancer.  The patient had a mammogram on 08/27/2024 which showed a left breast area of asymmetry or possible mass.  She had a diagnostic mammogram and ultrasound on 09/02/2024 which showed a spiculated mass measuring 17 mm, at 2 o'clock position, 6 cm from nipple.  There was an additional 7 mm area of asymmetry posterior to this main mass.  She had biopsy of these 2 areas which showed invasive mammary carcinoma and invasive lobular carcinoma.  She has seen Dr. Jacobo on 09/09/2024 and now presents for discussion for surgery.  Overall the patient denies any family history of breast cancer.  She reports having 3 children at age 15 being her first pregnancy.  Her age of menarche was 75, she has not through menopause.  Has not had any prior biopsies.  She denies any palpable masses or pain in the left breast except some soreness after the biopsy itself.  Past Medical History: Past Medical History:  Diagnosis Date   Allergy    seasonal     Past Surgical History: Past Surgical History:  Procedure Laterality Date   BREAST BIOPSY Left 09/03/2024   US  LT BREAST BX W LOC DEV 1ST LESION IMG BX SPEC US  GUIDE 09/03/2024 [facility]   BREAST BIOPSY Left 09/03/2024   MM LT BREAST BX W LOC DEV 1ST LESION IMAGE BX SPEC STEREO GUIDE 09/03/2024 [facility]   carpel tunnel     COLONOSCOPY WITH PROPOFOL  N/A 04/05/2023   Procedure: COLONOSCOPY WITH PROPOFOL ;  Surgeon: Onita Elspeth Sharper, DO;  Location: [facility] ENDOSCOPY;  Service: Gastroenterology;  Laterality: N/A;   TUBAL LIGATION      Home Medications: Prior to Admission medications   Medication Sig Start Date End Date Taking? Authorizing Provider  meloxicam  (MOBIC ) 15 MG tablet TAKE 1 TABLET(15 MG) BY MOUTH DAILY 09/15/24  Yes  Simmons-Robinson, Makiera, MD  metFORMIN  (GLUCOPHAGE -XR) 500 MG 24 hr tablet Take 1 tablet (500 mg total) by mouth daily with breakfast. 08/17/24  Yes Simmons-Robinson, Makiera, MD  SUMAtriptan  (IMITREX ) 25 MG tablet Take 1 tablet (25 mg total) by mouth every 2 (two) hours as needed for migraine. May repeat in 2 hours if headache persists or recurs. 08/17/24  Yes Simmons-Robinson, Makiera, MD    Allergies: No Known Allergies  Social History:  reports that she has never smoked. She has never used smokeless tobacco. She reports that she does not drink alcohol and does not use drugs.   Family History: Family History  Problem Relation Age of Onset   Ovarian cancer Mother    Diabetes Mother    Hypertension Mother    Diabetes Maternal Grandmother    Hypertension Maternal Grandmother    Breast cancer Neg Hx     Review of Systems: Review of Systems  Constitutional:  Negative for chills and fever.  HENT:  Negative for hearing loss.   Respiratory:  Negative for shortness of breath.   Cardiovascular:  Negative for chest pain.  Gastrointestinal:  Negative for nausea and vomiting.  Genitourinary:  Negative for dysuria.  Musculoskeletal:  Negative for myalgias.  Skin:  Negative for rash.  Neurological:  Negative for dizziness.  Psychiatric/Behavioral:  Negative for depression.     Physical Exam BP (!) 142/93   Pulse 71   Temp 98.2 F (36.8 C) (Oral)   Ht  5' 5 (1.651 m)   Wt 270 lb 12.8 oz (122.8 kg)   LMP 10/22/2012 (Approximate) Comment: no periods.   SpO2 98%   BMI 45.06 kg/m  CONSTITUTIONAL: No acute distress HEENT:  Normocephalic, atraumatic, extraocular motion intact. NECK: Trachea is midline, and there is no jugular venous distension.  RESPIRATORY:  Lungs are clear, and breath sounds are equal bilaterally. Normal respiratory effort without pathologic use of accessory muscles. CARDIOVASCULAR: Heart is regular without murmurs, gallops, or rubs. BREAST: Left breast without  any palpable masses, skin changes, or nipple changes.  Biopsy site is well-healed and there is only some palpable inflammation at the site of biopsy.  No left axillary lymphadenopathy.  Right breast without any palpable masses, skin changes, nipple changes.  No right axillary lymphadenopathy. MUSCULOSKELETAL:  Normal muscle strength and tone in all four extremities.  No peripheral edema or cyanosis. SKIN: Skin turgor is normal. There are no pathologic skin lesions.  NEUROLOGIC:  Motor and sensation is grossly normal.  Cranial nerves are grossly intact. PSYCH:  Alert and oriented to person, place and time. Affect is normal.  Laboratory Analysis: Left breast biopsy on 09/03/2024: FINAL DIAGNOSIS      1. Breast, left, needle core biopsy, 2:00 6cmfn 15mm (venus clip) :      INVASIVE MAMMARY CARCINOMA      MAMMARY CARCINOMA IN SITU, SOLID, INTERMEDIATE NUCLEAR GRADE      TUBULE FORMATION: SCORE 3      NUCLEAR PLEOMORPHISM: SCORE 2      MITOTIC COUNT: SCORE 1      TOTAL SCORE: 6      OVERALL GRADE: 2      LYMPHOVASCULAR INVASION: NOT IDENTIFIED      CANCER LENGTH: 13 MM / 1.3 CM      CALCIFICATIONS: NOT IDENTIFIED      OTHER FINDINGS: NONE       2. Breast, left, needle core biopsy, UOQ 7mm (coil clip) :      INVASIVE LOBULAR CARCINOMA      LOBULAR CARCINOMA IN SITU, INTERMEDIATE NUCLEAR GRADE      TUBULE FORMATION: SCORE 3      NUCLEAR PLEOMORPHISM: SCORE 2      MITOTIC COUNT: SCORE 1      TOTAL SCORE: 6      OVERALL GRADE: 2      LYMPHOVASCULAR INVASION: NOT IDENTIFIED      CANCER LENGTH: 7 MM / 0.7 CM      CALCIFICATIONS: NOT IDENTIFIED      OTHER FINDINGS: NONE   Imaging: Mammogram and ultrasound on 09/02/2024: FINDINGS: Full field lateral and spot compression view(s) were performed and demonstrate a spiculated mass with associated architectural distortion measuring approximately 17 mm in the LEFT breast 2 o'clock position. Posterior to this mass is a 7 mm asymmetry seen only on  the MLO view.   Targeted LEFT breast ultrasound demonstrates a 15 x 11 x 12 mm irregular spiculated hypoechoic mass with posterior shadowing at the 2 o'clock position 6 cm from the nipple. No correlate for the nearby asymmetry seen on mammography. LEFT axillary ultrasound was performed without suspicious lymphadenopathy.   IMPRESSION: Highly suspicious LEFT breast mass at 2 o'clock 4 cm from the nipple measuring 15 mm correlates with the mass described at the time of screening. Ultrasound-guided biopsy is recommended.   A nearby LEFT breast 7 mm asymmetry seen only on the MLO view and without sonographic correlate is also suspicious for malignancy and warrants further characterization with stereotactic  biopsy.   RECOMMENDATION: Ultrasound-guided biopsy of the LEFT breast x1. Stereotactic biopsy of the LEFT breast x1.   I have discussed the findings and recommendations with the patient. The biopsy procedure was discussed with the patient and questions were answered. Patient expressed their understanding of the biopsy recommendation. Patient will be scheduled for biopsy at her earliest convenience by the schedulers. Ordering provider will be notified. If applicable, a reminder letter will be sent to the patient regarding the next appointment.   BI-RADS CATEGORY  5: Highly suggestive of malignancy.  Assessment and Plan: This is a 58 y.o. female with left breast cancer.  -Discussed with the patient the findings on her imaging and biopsy results.  She has two areas within the upper outer quadrant of the left breast which are positive for cancer.  Discussed with the patient some of the risk factors associated with breast cancer.  Discussed with her the potential surgical options including lumpectomy versus mastectomy, with evaluation of the left axillary lymph nodes via sentinel lymph node biopsy.  Discussed with her the recommendation for radiation if we do lumpectomy versus likely no radiation if we  do mastectomy.  Discussed with her that lumpectomy or mastectomy would not affect the need for doing a sentinel lymph node biopsy as this is done for staging purposes given her invasive cancer.  After further discussion, the patient has opted to proceed with lumpectomy and sentinel lymph node biopsy, followed by radiation. -Discussed with patient and the plan for a left breast tag localized lumpectomy of 2 sites with a left axillary sentinel lymph node biopsy.  Reviewed with her the surgery at length including preoperative localization at the [facility] breast center, planned incisions, risks of bleeding, infection, injury to surrounding structures, lymphedema, that this would be an outpatient procedure, use of radioactive tracer to identify the sentinel lymph nodes as well as the use of a blue dye to identify the sentinel lymph nodes, postoperative activity restrictions, pain control, and she is willing to proceed. -Patient will be scheduled for surgery on 10/01/2024.  All of her questions have been answered.  I spent 55 minutes dedicated to the care of this patient on the date of this encounter to include pre-visit review of records, face-to-face time with the patient discussing diagnosis and management, and any post-visit coordination of care.   Aloysius Sheree Plant, MD [facility]

## 2024-09-21 NOTE — Patient Instructions (Signed)
 We have spoken today about removing a lump in your breast. This will be done by Dr. Mauri Sous at [facility].  If you are on any injectable weight loss medication, you will need to stop taking your GLP-1 injectable (weight loss) medications 8 days before your surgery to avoid any complications with anesthesia.   You will most likely be able to leave the hospital several hours after your surgery. Rarely, a patient needs to stay over night but this is a possibility.  Plan to tentatively be off work for 1-2 weeks following the surgery and may return with approximately 2 more weeks of a lifting restriction, no greater than 15 lbs.  Please see your Blue surgery sheet for more information. Our surgery scheduler will call you to look at surgery dates and to go over information.   If you have FMLA or Disability paperwork that needs to be filled out, please have your company fax your paperwork to [phone] or you may drop this by either office. This paperwork will be filled out within 3 days after your surgery has been completed.    Lumpectomy A lumpectomy is a form of breast conserving or breast preservation surgery. It may also be referred to as a partial mastectomy. During a lumpectomy, the portion of the breast that contains the cancerous tumor or breast mass (the lump) is removed. Some normal tissue around the lump may also be removed to make sure all of the tumor has been removed.  LET [facility] CARE PROVIDER KNOW ABOUT: Any allergies you have. All medicines you are taking, including vitamins, herbs, eye drops, creams, and over-the-counter medicines. Previous problems you or members of your family have had with the use of anesthetics. Any blood disorders you have. Previous surgeries you have had. Medical conditions you have. RISKS AND COMPLICATIONS Generally, this is a safe procedure. However, problems can occur and include: Bleeding. Infection. Pain. Temporary swelling. Change in the  shape of the breast, particularly if a large portion is removed. BEFORE THE PROCEDURE Ask your health care provider about changing or stopping your regular medicines. This is especially important if you are taking diabetes medicines or blood thinners. Do not eat or drink anything after midnight on the night before the procedure or as directed by your health care provider. Ask your health care provider if you can take a sip of water with any approved medicines. On the day of surgery, your health care provider will use a mammogram or ultrasound to locate and mark the tumor in your breast. These markings on your breast will show where the cut (incision) will be made.   PROCEDURE  An IV tube will be put into one of your veins. You may be given medicine to help you relax before the surgery (sedative). You will be given one of the following: A medicine that numbs the area (local anesthetic). A medicine that makes you fall asleep (general anesthetic). Your health care provider will use a kind of electric scalpel that uses heat to minimize bleeding (electrocautery knife). A curved incision (like a smile or frown) that follows the natural curve of your breast is made, to allow for minimal scarring and better healing. The tumor will be removed with some of the surrounding tissue. This will be sent to the lab for analysis. Your health care provider may also remove your lymph nodes at this time if needed. Sometimes, but not always, a rubber tube called a drain will be surgically inserted into your breast area or  armpit to collect excess fluid that may accumulate in the space where the tumor was. This drain is connected to a plastic bulb on the outside of your body. This drain creates suction to help remove the fluid. The incisions will be closed with stitches (sutures). A bandage may be placed over the incisions. AFTER THE PROCEDURE You will be taken to the recovery area. You will be given medicine for  pain. A small rubber drain may be placed in the breast for 2-3 days to prevent a collection of blood (hematoma) from developing in the breast. You will be given instructions on caring for the drain before you go home. A pressure bandage (dressing) will be applied for 1-2 days to prevent bleeding. Ask your health care provider how to care for your bandage at home.   This information is not intended to replace advice given to you by your health care provider. Make sure you discuss any questions you have with your health care provider.   Document Released: 11/19/2006 Document Revised: 10/29/2014 Document Reviewed: 03/13/2013 Elsevier Interactive Patient Education [employer]

## 2024-09-22 ENCOUNTER — Telehealth: Payer: Self-pay | Admitting: Surgery

## 2024-09-22 ENCOUNTER — Encounter: Payer: Self-pay | Admitting: *Deleted

## 2024-09-22 DIAGNOSIS — C50912 Malignant neoplasm of unspecified site of left female breast: Secondary | ICD-10-CM

## 2024-09-22 NOTE — Telephone Encounter (Signed)
 Patient has been advised of Pre-Admission date/time, and Surgery date at [facility].  Surgery Date: 10/01/24, patient to arrive 11:15 am as will be having SLN bx injection done first prior to her surgery.   Preadmission Testing Date: 09/25/24 (phone 1p-4p)  Patient informed of the scheduling process and surgery information given at time of office visit.   Patient also reminded of breast tags to be placed at [facility] 09/24/24.

## 2024-09-22 NOTE — Progress Notes (Signed)
 Lumpectomy is scheduled for 10/01/24, she will see Dr. Jacobo and Dr. Lenn on 10/27/24.   Appt. Details given to her.

## 2024-09-24 ENCOUNTER — Ambulatory Visit
Admission: RE | Admit: 2024-09-24 | Discharge: 2024-09-24 | Disposition: A | Discharge status: Home / Self Care | Source: Ambulatory Visit | Attending: Surgery | Admitting: Surgery

## 2024-09-24 ENCOUNTER — Inpatient Hospital Stay: Admission: RE | Admit: 2024-09-24 | Discharge: 2024-09-24 | Attending: Surgery

## 2024-09-24 DIAGNOSIS — C50919 Malignant neoplasm of unspecified site of unspecified female breast: Secondary | ICD-10-CM

## 2024-09-24 HISTORY — PX: BREAST BIOPSY: SHX20

## 2024-09-24 MED ORDER — LIDOCAINE HCL 1 % IJ SOLN
10.0000 mL | Freq: Once | INTRAMUSCULAR | Status: AC
  Administered 2024-09-24: 10 mL

## 2024-09-25 ENCOUNTER — Telehealth: Payer: Self-pay | Admitting: *Deleted

## 2024-09-25 ENCOUNTER — Other Ambulatory Visit: Payer: Self-pay

## 2024-09-25 ENCOUNTER — Inpatient Hospital Stay: Admission: RE | Admit: 2024-09-25 | Discharge: 2024-09-25 | Attending: Surgery

## 2024-09-25 DIAGNOSIS — Z01810 Encounter for preprocedural cardiovascular examination: Secondary | ICD-10-CM

## 2024-09-25 DIAGNOSIS — I10 Essential (primary) hypertension: Secondary | ICD-10-CM

## 2024-09-25 DIAGNOSIS — C50912 Malignant neoplasm of unspecified site of left female breast: Secondary | ICD-10-CM

## 2024-09-25 DIAGNOSIS — D649 Anemia, unspecified: Secondary | ICD-10-CM

## 2024-09-25 DIAGNOSIS — Z01812 Encounter for preprocedural laboratory examination: Secondary | ICD-10-CM

## 2024-09-25 HISTORY — DX: Morbid (severe) obesity due to excess calories: E66.01

## 2024-09-25 HISTORY — DX: Anemia, unspecified: D64.9

## 2024-09-25 HISTORY — DX: Malignant neoplasm of unspecified site of left female breast: C50.912

## 2024-09-25 HISTORY — DX: Migraine, unspecified, not intractable, without status migrainosus: G43.909

## 2024-09-25 HISTORY — DX: Essential (primary) hypertension: I10

## 2024-09-25 HISTORY — DX: Depression, unspecified: F32.A

## 2024-09-25 NOTE — Patient Instructions (Addendum)
 Your procedure is scheduled on:10-01-24 Thursday Report to the Registration Desk on the 1st floor of the [facility].Then proceed to the Radiology Desk (2nd desk on right) Arrive at 11:15 am   REMEMBER: Instructions that are not followed completely may result in serious medical risk, up to and including death; or upon the discretion of your surgeon and anesthesiologist your surgery may need to be rescheduled.  Do not eat food after midnight the night before surgery.  No gum chewing or hard candies.  You may however, drink CLEAR liquids up to 2 hours before you are scheduled to arrive for your surgery. Do not drink anything within 2 hours of your scheduled arrival time.  Clear liquids include: - water  - apple juice without pulp - gatorade (not RED colors) - black coffee or tea (Do NOT add milk or creamers to the coffee or tea) Do NOT drink anything that is not on this list.  One week prior to surgery:Stop NOW (09-25-24) Stop Anti-inflammatories (NSAIDS) such as meloxicam  (MOBIC ) Advil, Aleve, Ibuprofen, Motrin, Naproxen, Naprosyn and Aspirin based products such as Excedrin, Goody's Powder, BC Powder. Stop ANY OVER THE COUNTER supplements until after surgery.  You may however, continue to take Tylenol if needed for pain up until the day of surgery.  Stop metFORMIN  (GLUCOPHAGE -XR) 2 days prior to surgery-Last dose will be on 09-28-24 Monday  Continue taking all of your other prescription medications up until the day of surgery.  Do NOT take any medication the day of surgery  No Alcohol for 24 hours before or after surgery.  No Smoking including e-cigarettes for 24 hours before surgery.  No chewable tobacco products for at least 6 hours before surgery.  No nicotine patches on the day of surgery.  Do not use any recreational drugs for at least a week (preferably 2 weeks) before your surgery.  Please be advised that the combination of cocaine and anesthesia may have negative  outcomes, up to and including death. If you test positive for cocaine, your surgery will be cancelled.  On the morning of surgery brush your teeth with toothpaste and water, you may rinse your mouth with mouthwash if you wish. Do not swallow any toothpaste or mouthwash.  Use CHG Soap as directed on instruction sheet.  Do not wear jewelry, make-up, hairpins, clips or nail polish.  For welded (permanent) jewelry: bracelets, anklets, waist bands, etc.  Please have this removed prior to surgery.  If it is not removed, there is a chance that hospital personnel will need to cut it off on the day of surgery.  Do not wear lotions, powders, or perfumes.   Do not shave body hair from the neck down 48 hours before surgery.  Contact lenses, hearing aids and dentures may not be worn into surgery.  Do not bring valuables to the hospital. [facility] is not responsible for any missing/lost belongings or valuables.   Notify your doctor if there is any change in your medical condition (cold, fever, infection).  Wear comfortable clothing (specific to your surgery type) to the hospital.  After surgery, you can help prevent lung complications by doing breathing exercises.  Take deep breaths and cough every 1-2 hours. Your doctor may order a device called an Incentive Spirometer to help you take deep breaths. When coughing or sneezing, hold a pillow firmly against your incision with both hands. This is called "splinting." Doing this helps protect your incision. It also decreases belly discomfort.  If you are being admitted  to the hospital overnight, leave your suitcase in the car. After surgery it may be brought to your room.  In case of increased patient census, it may be necessary for you, the patient, to continue your postoperative care in the Same Day Surgery department.  If you are being discharged the day of surgery, you will not be allowed to drive home. You will need a responsible individual to  drive you home and stay with you for 24 hours after surgery.   If you are taking public transportation, you will need to have a responsible individual with you.  Please call the Pre-admissions Testing Dept. at [phone] if you have any questions about these instructions.  Surgery Visitation Policy:  Patients having surgery or a procedure may have two visitors.  Children under the age of 55 must have an adult with them who is not the patient.                                                                                                             Preparing for Surgery with CHLORHEXIDINE GLUCONATE (CHG) Soap  Chlorhexidine Gluconate (CHG) Soap  o An antiseptic cleaner that kills germs and bonds with the skin to continue killing germs even after washing  o Used for showering the night before surgery and morning of surgery  Before surgery, you can play an important role by reducing the number of germs on your skin.  CHG (Chlorhexidine gluconate) soap is an antiseptic cleanser which kills germs and bonds with the skin to continue killing germs even after washing.  Please do not use if you have an allergy to CHG or antibacterial soaps. If your skin becomes reddened/irritated stop using the CHG.  1. Shower the NIGHT BEFORE SURGERY with CHG soap.  2. If you choose to wash your hair, wash your hair first as usual with your normal shampoo.  3. After shampooing, rinse your hair and body thoroughly to remove the shampoo.  4. Use CHG as you would any other liquid soap. You can apply CHG directly to the skin and wash gently with a clean washcloth.  5. Apply the CHG soap to your body only from the neck down. Do not use on open wounds or open sores. Avoid contact with your eyes, ears, mouth, and genitals (private parts). Wash face and genitals (private parts) with your normal soap.  6. Wash thoroughly, paying special attention to the area where your surgery will be performed.  7.  Thoroughly rinse your body with warm water.  8. Do not shower/wash with your normal soap after using and rinsing off the CHG soap.  9. Do not use lotions, oils, etc., after showering with CHG.  10. Pat yourself dry with a clean towel.  11. Wear clean pajamas to bed the night before surgery.  12. Place clean sheets on your bed the night of your shower and do not sleep with pets.  13. Do not apply any deodorants/lotions/powders.  14. Please wear clean clothes to the hospital.  15. Remember to brush  your teeth with your regular toothpaste.   [employer], [employer] to address health-related social needs:  [email or web address]

## 2024-09-25 NOTE — Telephone Encounter (Signed)
Faxed FMLA to [employer] at [phone] 

## 2024-09-28 ENCOUNTER — Telehealth: Payer: Self-pay | Admitting: Surgery

## 2024-09-28 NOTE — Telephone Encounter (Signed)
 Daughter, Cherrie Cooler, DOB: 04/13/96 will be staying out of work to help her mom after surgery. She needs a work note to provide to her employer stating that she will be out of work to care for her mom after surgery for dates of 12/13, 12/14 and 10/07/24.  Please call Cherrie when work note ready. Thank you.

## 2024-09-28 NOTE — Telephone Encounter (Signed)
 Work note completed and Cherrie Deutscher has been notified.

## 2024-09-29 ENCOUNTER — Encounter
Admission: RE | Admit: 2024-09-29 | Discharge: 2024-09-29 | Disposition: A | Discharge status: Home / Self Care | Source: Ambulatory Visit | Attending: Surgery | Admitting: Surgery

## 2024-09-29 DIAGNOSIS — Z01812 Encounter for preprocedural laboratory examination: Secondary | ICD-10-CM

## 2024-09-29 DIAGNOSIS — E66813 Obesity, class 3: Secondary | ICD-10-CM | POA: Diagnosis not present

## 2024-09-29 DIAGNOSIS — Z6841 Body Mass Index (BMI) 40.0 and over, adult: Secondary | ICD-10-CM | POA: Diagnosis not present

## 2024-09-29 DIAGNOSIS — I10 Essential (primary) hypertension: Secondary | ICD-10-CM | POA: Diagnosis not present

## 2024-09-29 DIAGNOSIS — D649 Anemia, unspecified: Secondary | ICD-10-CM

## 2024-09-29 DIAGNOSIS — Z01810 Encounter for preprocedural cardiovascular examination: Secondary | ICD-10-CM

## 2024-09-29 DIAGNOSIS — D0512 Intraductal carcinoma in situ of left breast: Secondary | ICD-10-CM | POA: Diagnosis not present

## 2024-09-29 DIAGNOSIS — C50912 Malignant neoplasm of unspecified site of left female breast: Secondary | ICD-10-CM | POA: Diagnosis not present

## 2024-09-29 LAB — BASIC METABOLIC PANEL WITH GFR
Anion gap: 11 (ref 5–15)
BUN: 17 mg/dL (ref 6–20)
CO2: 25 mmol/L (ref 22–32)
Calcium: 9 mg/dL (ref 8.9–10.3)
Chloride: 103 mmol/L (ref 98–111)
Creatinine, Ser: 0.9 mg/dL (ref 0.44–1.00)
GFR, Estimated: >60 mL/min (ref >60)
Glucose, Bld: 81 mg/dL (ref 70–99)
Potassium: 4.1 mmol/L (ref 3.5–5.1)
Sodium: 138 mmol/L (ref 135–145)

## 2024-09-29 LAB — CBC
HCT: 40.3 % (ref 36.0–46.0)
Hemoglobin: 12.5 g/dL (ref 12.0–15.0)
MCH: 27.6 pg (ref 26.0–34.0)
MCHC: 31 g/dL (ref 30.0–36.0)
MCV: 89 fL (ref 80.0–100.0)
Platelets: 274 K/uL (ref 150–400)
RBC: 4.53 MIL/uL (ref 3.87–5.11)
RDW: 13.2 % (ref 11.5–15.5)
WBC: 8.5 K/uL (ref 4.0–10.5)
nRBC: 0 % (ref 0.0–0.2)

## 2024-09-30 ENCOUNTER — Encounter: Payer: Self-pay | Admitting: Occupational Therapy

## 2024-09-30 ENCOUNTER — Inpatient Hospital Stay: Attending: Oncology | Admitting: Occupational Therapy

## 2024-09-30 DIAGNOSIS — C50912 Malignant neoplasm of unspecified site of left female breast: Secondary | ICD-10-CM | POA: Diagnosis present

## 2024-09-30 MED ORDER — CEFAZOLIN SODIUM-DEXTROSE 3-4 GM/150ML-% IV SOLN
3.0000 g | INTRAVENOUS | Status: AC
  Administered 2024-10-01: 3 g via INTRAVENOUS
  Filled 2024-09-30: qty 150

## 2024-09-30 MED ORDER — GABAPENTIN 300 MG PO CAPS
300.0000 mg | ORAL_CAPSULE | ORAL | Status: AC
  Administered 2024-10-01: 300 mg via ORAL

## 2024-09-30 MED ORDER — ORAL CARE MOUTH RINSE: 15.0000 mL | Freq: Once | OROMUCOSAL | Status: AC

## 2024-09-30 MED ORDER — ACETAMINOPHEN 500 MG PO TABS
1000.0000 mg | ORAL_TABLET | ORAL | Status: AC
  Administered 2024-10-01: 1000 mg via ORAL

## 2024-09-30 MED ORDER — CHLORHEXIDINE GLUCONATE 0.12 % MT SOLN
15.0000 mL | Freq: Once | OROMUCOSAL | Status: AC
  Administered 2024-10-01: 15 mL via OROMUCOSAL

## 2024-09-30 MED ORDER — LACTATED RINGERS IV SOLN: INTRAVENOUS | Status: DC

## 2024-09-30 NOTE — Therapy (Signed)
 OUTPATIENT OCCUPATIONAL THERAPY BREAST CANCER POSTOP EVALUATION    Patient Name: Vickie Gutierrez MRN: [id number] DOB:26-Mar-1966, 58 y.o., female Today's Date: 09/30/2024  END OF SESSION:  OT End of Session - 09/30/24 0926     Visit Number 1    Number of Visits 6    Date for Recertification  12/23/24    OT Start Time 0800    OT Stop Time 0832    OT Time Calculation (min) 32 min    Activity Tolerance Patient tolerated treatment well    Behavior During Therapy [facility] for tasks assessed/performed          Past Medical History:  Diagnosis Date   Allergy    seasonal   Anemia    Depression    Hypertension    no medications   Invasive ductal carcinoma of left breast (HCC)    Migraines    Morbid obesity (HCC)    Past Surgical History:  Procedure Laterality Date   BREAST BIOPSY Left 09/03/2024   US  LT BREAST BX W LOC DEV 1ST LESION IMG BX SPEC US  GUIDE 09/03/2024 [facility]   BREAST BIOPSY Left 09/03/2024   MM LT BREAST BX W LOC DEV 1ST LESION IMAGE BX SPEC STEREO GUIDE 09/03/2024 [facility]   BREAST BIOPSY  09/24/2024   MM LT BREAST SAVI/RF TAG 1ST LESION MAMMO GUIDE 09/24/2024 [facility]   BREAST BIOPSY  09/24/2024   MM LT BREAST SAVI/RF TAG EA ADD'L LESION MAMMO GUIDE 09/24/2024 [facility]   carpel tunnel Right    COLONOSCOPY WITH PROPOFOL  N/A 04/05/2023   Procedure: COLONOSCOPY WITH PROPOFOL ;  Surgeon: Onita Elspeth Sharper, DO;  Location: [facility];  Service: Gastroenterology;  Laterality: N/A;   TONSILLECTOMY     TUBAL LIGATION     Patient Active Problem List   Diagnosis Date Noted   Invasive ductal carcinoma of left breast (HCC) 09/09/2024   Elevated blood pressure reading 05/09/2017   Reactive depression 05/09/2017   BMI 40.0-44.9, adult (HCC) 01/08/2017   Environmental and seasonal allergies 01/08/2017   Migraine without status migrainosus, not intractable 01/08/2017   Arthralgia of right knee 01/08/2017    PCP: Dr  Sharma  REFERRING PROVIDER: Dr Jacobo  REFERRING DIAG: L breast Cancer with lumpectomy 10/01/24  THERAPY DIAG:  Invasive ductal carcinoma of left breast [facility])  Rationale for Evaluation and Treatment: Rehabilitation  ONSET DATE: 09/03/24  SUBJECTIVE:                                                                                                                                                                                           SUBJECTIVE STATEMENT: I am having  lumpectomy tomorrow.  I am so scared.  I do not like needles.  My breast is still sore from the biopsy. PERTINENT HISTORY:  Dr Desiderio, 09/21/24 visit  Assessment and Plan: This is a 58 y.o. female with left breast cancer.   -Discussed with the patient the findings on her imaging and biopsy results.  She has two areas within the upper outer quadrant of the left breast which are positive for cancer.  Discussed with the patient some of the risk factors associated with breast cancer.  Discussed with her the potential surgical options including lumpectomy versus mastectomy, with evaluation of the left axillary lymph nodes via sentinel lymph node biopsy.  Discussed with her the recommendation for radiation if we do lumpectomy versus likely no radiation if we do mastectomy.  Discussed with her that lumpectomy or mastectomy would not affect the need for doing a sentinel lymph node biopsy as this is done for staging purposes given her invasive cancer.  After further discussion, the patient has opted to proceed with lumpectomy and sentinel lymph node biopsy, followed by radiation. -Discussed with patient and the plan for a left breast tag localized lumpectomy of 2 sites with a left axillary sentinel lymph node biopsy.  Reviewed with her the surgery at length including preoperative localization at the [facility] breast center, planned incisions, risks of bleeding, infection, injury to surrounding structures, lymphedema, that this  would be an outpatient procedure, use of radioactive tracer to identify the sentinel lymph nodes as well as the use of a blue dye to identify the sentinel lymph nodes, postoperative activity restrictions, pain control, and she is willing to proceed. -Patient will be scheduled for surgery on 10/01/2024.  All of her questions have been answered.   PATIENT GOALS:   reduce lymphedema risk and learn post op HEP.   PAIN:  Are you having pain? No-just reports some soreness in left breast  PRECAUTIONS: Active CA , left lymphedema precautions after surgery   HAND DOMINANCE: right  WEIGHT BEARING RESTRICTIONS: No  FALLS:  Has patient fallen in last 6 months? 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  Has a little dog.  Likes to travel with her daughter     OBJECTIVE:  COGNITION: Overall cognitive status: Within functional limits for tasks assessed    POSTURE:  rounded shoulders posture  UPPER EXTREMITY AROM/PROM: Bilateral shoulder active range of motion within normal limits.  Pain-free. CERVICAL AROM: All within normal limits:     UPPER EXTREMITY STRENGTH: Strength in bilateral shoulder in all range 5 -/5  LYMPHEDEMA ASSESSMENTS:   LANDMARK RIGHT   eval  10 cm proximal to olecranon process   Olecranon process   10 cm proximal to ulnar styloid process   Just proximal to ulnar styloid process   Across hand at thumb web space   At base of 2nd digit   (Blank rows = not tested)  LANDMARK LEFT   eval  10 cm proximal to olecranon process   Olecranon process   10 cm proximal to ulnar styloid process   Just proximal to ulnar styloid process   Across hand at thumb web space   At base of 2nd digit   (Blank rows = not tested)  L-DEX LYMPHEDEMA SCREENING:  The patient was  assessed using the  L-Dex machine today to produce a lymphedema index baseline score. The patient will be reassessed on a regular basis (typically every 3 months) to obtain new L-Dex scores. If the score is > 6.5 points away from his/her baseline score indicating onset of subclinical lymphedema, it will be recommended to wear a compression garment for 4 weeks, 12 hours per day and then be reassessed. If the score continues to be > 6.5 points from baseline at reassessment, we will initiate lymphedema treatment. Assessing in this manner has a 95% rate of preventing clinically significant lymphedema.   L-DEX FLOWSHEETS - 09/30/24 0900       L-DEX LYMPHEDEMA SCREENING   Measurement Type Unilateral    L-DEX MEASUREMENT EXTREMITY Upper Extremity    POSITION  Standing    DOMINANT SIDE Right    At Risk Side Left    BASELINE SCORE (UNILATERAL) -1           PATIENT EDUCATION:  Education details: Lymphedema risk reduction and post op shoulder/posture HEP Person educated: Patient Education method: Explanation, Demonstration, Handout Education comprehension: Patient verbalized understanding and returned demonstration  HOME EXERCISE PROGRAM: Patient was instructed today in a home exercise program today for post op shoulder range of motion. These included active assist shoulder flexion in standing/supine, scapular retraction, wall walking/slides with shoulder abduction, and hands behind head external rotation in supine.  She was encouraged to do these 2-3 x day, holding 3 seconds and repeating 10 times when permitted by her physician/surgeon.   ASSESSMENT:  CLINICAL IMPRESSION: Patient presented at Occupational Therapy - planning to have left lumpectomy tomorrow.  With possible radiation.  Patient baseline L-Dex score was done today as well as educated in lymphedema signs ,symptoms, precautions as well as prevention.  Handout provided.  Patient was also provided and reviewed postop home exercises for  shoulder. She will benefit from a post op OT reassessment to determine needs and from L-Dex screens every 3 months for 2 years to detect subclinical lymphedema.  Pt will benefit from skilled therapeutic intervention to improve on the following deficits: Decreased knowledge of precautions and lymphedema education, impaired UE functional use, pain, decreased ROM, postural dysfunction.   OT treatment/interventions: ADL/self-care home management, pt/family education, therapeutic exercise,manual therapy  REHAB POTENTIAL: Good  CLINICAL DECISION MAKING: Stable/uncomplicated  EVALUATION COMPLEXITY: Low   GOALS: Goals reviewed with patient? YES  LONG TERM GOALS: (STG=LTG)    Name Target Date Goal status  1 Pt will be able to verbalize understanding of pertinent lymphedema risk reduction practices relevant to her dx specifically related to skin care.  Baseline:  No knowledge 12 weeks Initial  2 Pt will be able to return demo and/or verbalize understanding of the post op HEP related to regaining shoulder ROM. Baseline:  No knowledge Today Achieved at eval       4 Pt will demo she has regained full shoulder ROM and function post operatively compared to baselines.  Baseline: See objective measurements taken today. 12 weeks Initial    PLAN:  OT FREQUENCY/DURATION: EVAL and follow-up appointments as needed-12 weeks  PLAN FOR NEXT SESSION: will reassess 3 weeks post op to determine needs.    Occupational Therapy Information for After Breast Cancer Surgery/Treatment:  Lymphedema is a swelling condition that you may be at risk for in your arm if you have lymph nodes removed from the armpit area.  After a sentinel node biopsy, the risk is approximately 5-9% and is higher after an axillary node dissection.  There is treatment  available for this condition and it is not life-threatening.  Contact your physician or occupational therapist with concerns. You may begin the 4 shoulder/posture exercises  (see additional sheet) when permitted by your physician (typically a week after surgery).  If you have drains, you may need to wait until those are removed before beginning range of motion exercises.  A general recommendation is to not lift your arms above shoulder height until drains are removed.  These exercises should be done to your tolerance and gently.  This is not a no pain/no gain type of recovery so listen to your body and stretch into the range of motion that you can tolerate, stopping if you have pain.  If you are having immediate reconstruction, ask your plastic surgeon about doing exercises as he or she may want you to wait. .  While undergoing any medical procedure or treatment, try to avoid blood pressure being taken or needle sticks from occurring on the arm on the side of cancer.   This recommendation begins after surgery and continues for the rest of your life.  This may help reduce your risk of getting lymphedema (swelling in your arm). An excellent resource for those seeking information on lymphedema is the National Lymphedema Network's web site. It can be accessed at www.lymphnet.org If you notice swelling in your hand, arm or breast at any time following surgery (even if it is many years from now), please contact your doctor or occupational therapist to discuss this.  Lymphedema can be treated at any time but it is easier for you if it is treated early on.  If you feel like your shoulder motion is not returning to normal in a reasonable amount of time, please contact your surgeon or occupational therapist.  [facility] [phone]. [address] [place], [place] [address]  Patient was instructed today in a home exercise program today for post op shoulder range of motion. These included active assist shoulder flexion in standing/supine, scapular retraction, wall walking/slides with shoulder abduction, and hands behind head external rotation in supine.   She was encouraged to do these 2-3 x day, holding 3 seconds and repeating 10 times when permitted by her physician/surgeon      Ancel Peters, OTR/L,CLT 09/30/2024, 10:05 AM

## 2024-10-01 ENCOUNTER — Encounter: Admission: RE | Disposition: A | Payer: Self-pay | Attending: Surgery

## 2024-10-01 ENCOUNTER — Other Ambulatory Visit: Payer: Self-pay

## 2024-10-01 ENCOUNTER — Ambulatory Visit: Admission: RE | Admit: 2024-10-01 | Discharge: 2024-10-01 | Attending: Surgery

## 2024-10-01 ENCOUNTER — Ambulatory Visit: Admitting: Anesthesiology

## 2024-10-01 ENCOUNTER — Inpatient Hospital Stay: Admission: RE | Admit: 2024-10-01 | Discharge: 2024-10-01 | Attending: Surgery

## 2024-10-01 ENCOUNTER — Ambulatory Visit: Admission: RE | Admit: 2024-10-01 | Discharge: 2024-10-01 | Disposition: A | Attending: Surgery | Admitting: Surgery

## 2024-10-01 ENCOUNTER — Encounter: Payer: Self-pay | Admitting: Surgery

## 2024-10-01 DIAGNOSIS — E66813 Obesity, class 3: Secondary | ICD-10-CM | POA: Diagnosis not present

## 2024-10-01 DIAGNOSIS — C50912 Malignant neoplasm of unspecified site of left female breast: Secondary | ICD-10-CM

## 2024-10-01 DIAGNOSIS — C50412 Malignant neoplasm of upper-outer quadrant of left female breast: Secondary | ICD-10-CM | POA: Diagnosis not present

## 2024-10-01 DIAGNOSIS — Z6841 Body Mass Index (BMI) 40.0 and over, adult: Secondary | ICD-10-CM | POA: Diagnosis not present

## 2024-10-01 DIAGNOSIS — D0512 Intraductal carcinoma in situ of left breast: Secondary | ICD-10-CM | POA: Diagnosis not present

## 2024-10-01 DIAGNOSIS — I10 Essential (primary) hypertension: Secondary | ICD-10-CM | POA: Diagnosis not present

## 2024-10-01 DIAGNOSIS — Z170 Estrogen receptor positive status [ER+]: Secondary | ICD-10-CM | POA: Diagnosis not present

## 2024-10-01 DIAGNOSIS — C50919 Malignant neoplasm of unspecified site of unspecified female breast: Secondary | ICD-10-CM

## 2024-10-01 HISTORY — PX: AXILLARY SENTINEL NODE BIOPSY: SHX5738

## 2024-10-01 HISTORY — PX: BREAST LUMPECTOMY WITH RADIO FREQUENCY LOCALIZER: SHX6897

## 2024-10-01 SURGERY — BREAST LUMPECTOMY WITH RADIO FREQUENCY LOCALIZER
Anesthesia: General | Laterality: Left

## 2024-10-01 MED ORDER — TECHNETIUM TC 99M TILMANOCEPT KIT: 1.1800 | PACK | Freq: Once | INTRAVENOUS | Status: DC | PRN

## 2024-10-01 MED ORDER — ROCURONIUM BROMIDE 10 MG/ML (PF) SYRINGE
PREFILLED_SYRINGE | INTRAVENOUS | Status: AC
  Filled 2024-10-01: qty 10

## 2024-10-01 MED ORDER — PROPOFOL 10 MG/ML IV BOLUS
INTRAVENOUS | Status: AC
  Filled 2024-10-01: qty 20

## 2024-10-01 MED ORDER — ACETAMINOPHEN 10 MG/ML IV SOLN: 1000.0000 mg | Freq: Once | INTRAVENOUS | Status: DC | PRN

## 2024-10-01 MED ORDER — DEXMEDETOMIDINE HCL IN NACL 80 MCG/20ML IV SOLN
INTRAVENOUS | Status: DC | PRN
  Administered 2024-10-01 (×2): 12 ug via INTRAVENOUS

## 2024-10-01 MED ORDER — MIDAZOLAM HCL 2 MG/2ML IJ SOLN
INTRAMUSCULAR | Status: AC
  Filled 2024-10-01: qty 2

## 2024-10-01 MED ORDER — IBUPROFEN 600 MG PO TABS
600.0000 mg | ORAL_TABLET | Freq: Three times a day (TID) | ORAL | 1 refills | Status: DC | PRN
  Filled 2024-10-01: qty 60, 20d supply, fill #0

## 2024-10-01 MED ORDER — PROPOFOL 10 MG/ML IV BOLUS
INTRAVENOUS | Status: DC | PRN
  Administered 2024-10-01: 200 mg via INTRAVENOUS
  Administered 2024-10-01: 50 mg via INTRAVENOUS

## 2024-10-01 MED ORDER — SUGAMMADEX SODIUM 200 MG/2ML IV SOLN
INTRAVENOUS | Status: DC | PRN
  Administered 2024-10-01 (×2): 100 mg via INTRAVENOUS
  Administered 2024-10-01: 200 mg via INTRAVENOUS

## 2024-10-01 MED ORDER — FENTANYL CITRATE (PF) 100 MCG/2ML IJ SOLN
INTRAMUSCULAR | Status: AC
  Filled 2024-10-01: qty 2

## 2024-10-01 MED ORDER — CHLORHEXIDINE GLUCONATE 0.12 % MT SOLN
OROMUCOSAL | Status: AC
  Filled 2024-10-01: qty 15

## 2024-10-01 MED ORDER — LACTATED RINGERS IV SOLN: INTRAVENOUS | Status: DC

## 2024-10-01 MED ORDER — GABAPENTIN 300 MG PO CAPS
ORAL_CAPSULE | ORAL | Status: AC
  Filled 2024-10-01: qty 1

## 2024-10-01 MED ORDER — ROCURONIUM BROMIDE 100 MG/10ML IV SOLN
INTRAVENOUS | Status: DC | PRN
  Administered 2024-10-01: 30 mg via INTRAVENOUS
  Administered 2024-10-01: 40 mg via INTRAVENOUS

## 2024-10-01 MED ORDER — PROPOFOL 1000 MG/100ML IV EMUL
INTRAVENOUS | Status: AC
  Filled 2024-10-01: qty 100

## 2024-10-01 MED ORDER — ONDANSETRON HCL 4 MG/2ML IJ SOLN
INTRAMUSCULAR | Status: DC | PRN
  Administered 2024-10-01: 4 mg via INTRAVENOUS

## 2024-10-01 MED ORDER — METHYLENE BLUE (ANTIDOTE) 1 % IV SOLN
INTRAVENOUS | Status: AC
  Filled 2024-10-01: qty 10

## 2024-10-01 MED ORDER — ONDANSETRON HCL 4 MG/2ML IJ SOLN: 4.0000 mg | Freq: Once | INTRAMUSCULAR | Status: DC | PRN

## 2024-10-01 MED ORDER — LIDOCAINE HCL (CARDIAC) PF 100 MG/5ML IV SOSY
PREFILLED_SYRINGE | INTRAVENOUS | Status: DC | PRN
  Administered 2024-10-01: 100 mg via INTRAVENOUS

## 2024-10-01 MED ORDER — IBUPROFEN 600 MG PO TABS: 600.0000 mg | ORAL_TABLET | Freq: Three times a day (TID) | ORAL | 1 refills | Status: AC | PRN

## 2024-10-01 MED ORDER — CHLORHEXIDINE GLUCONATE CLOTH 2 % EX PADS
6.0000 | MEDICATED_PAD | Freq: Once | CUTANEOUS | Status: AC
  Administered 2024-10-01: 6 via TOPICAL

## 2024-10-01 MED ORDER — METHYLENE BLUE 20 MG/2ML IV SOSY
PREFILLED_SYRINGE | INTRAVENOUS | Status: DC | PRN
  Administered 2024-10-01: 5 mL

## 2024-10-01 MED ORDER — FENTANYL CITRATE (PF) 100 MCG/2ML IJ SOLN
INTRAMUSCULAR | Status: DC | PRN
  Administered 2024-10-01 (×2): 50 ug via INTRAVENOUS

## 2024-10-01 MED ORDER — FENTANYL CITRATE (PF) 100 MCG/2ML IJ SOLN: 25.0000 ug | INTRAMUSCULAR | Status: DC | PRN

## 2024-10-01 MED ORDER — ACETAMINOPHEN 500 MG PO TABS
ORAL_TABLET | ORAL | Status: AC
  Filled 2024-10-01: qty 2

## 2024-10-01 MED ORDER — LIDOCAINE HCL (PF) 2 % IJ SOLN
INTRAMUSCULAR | Status: AC
  Filled 2024-10-01: qty 5

## 2024-10-01 MED ORDER — OXYCODONE HCL 5 MG PO TABS
5.0000 mg | ORAL_TABLET | Freq: Once | ORAL | Status: AC | PRN
  Administered 2024-10-01: 5 mg via ORAL

## 2024-10-01 MED ORDER — STERILE WATER FOR IRRIGATION IR SOLN
Status: DC | PRN
  Administered 2024-10-01: 1000 mL

## 2024-10-01 MED ORDER — BUPIVACAINE-EPINEPHRINE (PF) 0.5% -1:200000 IJ SOLN
INTRAMUSCULAR | Status: AC
  Filled 2024-10-01: qty 30

## 2024-10-01 MED ORDER — LACTATED RINGERS IV SOLN: INTRAVENOUS | Status: DC | PRN

## 2024-10-01 MED ORDER — OXYCODONE HCL 5 MG PO TABS
ORAL_TABLET | ORAL | Status: AC
  Filled 2024-10-01: qty 1

## 2024-10-01 MED ORDER — CHLORHEXIDINE GLUCONATE CLOTH 2 % EX PADS: 6.0000 | MEDICATED_PAD | Freq: Once | CUTANEOUS | Status: DC

## 2024-10-01 MED ORDER — OXYCODONE HCL 5 MG/5ML PO SOLN: 5.0000 mg | Freq: Once | ORAL | Status: AC | PRN

## 2024-10-01 MED ORDER — OXYCODONE HCL 5 MG PO TABS: 5.0000 mg | ORAL_TABLET | ORAL | 0 refills | Status: DC | PRN

## 2024-10-01 MED ORDER — BUPIVACAINE-EPINEPHRINE 0.5% -1:200000 IJ SOLN
INTRAMUSCULAR | Status: DC | PRN
  Administered 2024-10-01: 20 mL

## 2024-10-01 MED ORDER — MIDAZOLAM HCL (PF) 2 MG/2ML IJ SOLN
INTRAMUSCULAR | Status: DC | PRN
  Administered 2024-10-01: 2 mg via INTRAVENOUS

## 2024-10-01 MED ORDER — OXYCODONE HCL 5 MG PO TABS
5.0000 mg | ORAL_TABLET | ORAL | 0 refills | Status: DC | PRN
  Filled 2024-10-01: qty 30, 5d supply, fill #0

## 2024-10-01 MED ORDER — SUCCINYLCHOLINE CHLORIDE 200 MG/10ML IV SOSY
PREFILLED_SYRINGE | INTRAVENOUS | Status: DC | PRN
  Administered 2024-10-01: 100 mg via INTRAVENOUS

## 2024-10-01 MED ORDER — ACETAMINOPHEN 500 MG PO TABS: 1000.0000 mg | ORAL_TABLET | Freq: Four times a day (QID) | ORAL | Status: AC | PRN

## 2024-10-01 SURGICAL SUPPLY — 36 items
BINDER BREAST LRG (GAUZE/BANDAGES/DRESSINGS) IMPLANT
BINDER BREAST MEDIUM (GAUZE/BANDAGES/DRESSINGS) IMPLANT
BINDER BREAST XLRG (GAUZE/BANDAGES/DRESSINGS) IMPLANT
BLADE PHOTON 3 ILLUMINATED (MISCELLANEOUS) ×1 IMPLANT
BLADE SURG 15 STRL LF DISP TIS (BLADE) ×1 IMPLANT
CHLORAPREP W/TINT 26 (MISCELLANEOUS) IMPLANT
CLIP APPLIE 9.375 SM OPEN (CLIP) IMPLANT
COVER PROBE GAMMA FINDER SLV (MISCELLANEOUS) ×1 IMPLANT
DERMABOND ADVANCED .7 DNX12 (GAUZE/BANDAGES/DRESSINGS) ×1 IMPLANT
DEVICE DUBIN SPECIMEN MAMMOGRA (MISCELLANEOUS) ×1 IMPLANT
DRAPE LAPAROTOMY 100X77 ABD (DRAPES) ×1 IMPLANT
DRSG GAUZE FLUFF 36X18 (GAUZE/BANDAGES/DRESSINGS) ×1 IMPLANT
ELECTRODE REM PT RTRN 9FT ADLT (ELECTROSURGICAL) ×1 IMPLANT
GAUZE 4X4 16PLY ~~LOC~~+RFID DBL (SPONGE) IMPLANT
GLOVE SURG SYN 7.0 PF PI (GLOVE) ×1 IMPLANT
GLOVE SURG SYN 7.5 PF PI (GLOVE) ×1 IMPLANT
GOWN STRL REUS W/ TWL LRG LVL3 (GOWN DISPOSABLE) ×2 IMPLANT
KIT MARKER MARGIN INK (KITS) IMPLANT
KIT TURNOVER KIT A (KITS) ×1 IMPLANT
LABEL OR SOLS (LABEL) ×1 IMPLANT
MANIFOLD NEPTUNE II (INSTRUMENTS) ×1 IMPLANT
NDL HYPO 22X1.5 SAFETY MO (MISCELLANEOUS) ×1 IMPLANT
NDL SAFETY ECLIP 18X1.5 (MISCELLANEOUS) ×1 IMPLANT
PACK BASIN MINOR ARMC (MISCELLANEOUS) ×1 IMPLANT
SHEATH BREAST BIOPSY SKIN MKR (SHEATH) ×1 IMPLANT
SOLN STERILE WATER 500 ML (IV SOLUTION) ×1 IMPLANT
SOLN STERILE WATER BTL 1000 ML (IV SOLUTION) ×1 IMPLANT
SUT SILK 3 0 SH 30 (SUTURE) IMPLANT
SUT VIC AB 2-0 SH 27XBRD (SUTURE) IMPLANT
SUT VIC AB 3-0 SH 27X BRD (SUTURE) ×1 IMPLANT
SUTURE EHLN 3-0 FS-10 30 BLK (SUTURE) IMPLANT
SUTURE MNCRL 4-0 27XMF (SUTURE) ×1 IMPLANT
SYR 10ML LL (SYRINGE) ×1 IMPLANT
TAPE TRANSPORE STRL 2 31045 (GAUZE/BANDAGES/DRESSINGS) IMPLANT
TRAP FLUID SMOKE EVACUATOR (MISCELLANEOUS) ×1 IMPLANT
TRAP NEPTUNE SPECIMEN COLLECT (MISCELLANEOUS) ×1 IMPLANT

## 2024-10-01 NOTE — Anesthesia Preprocedure Evaluation (Addendum)
 Anesthesia Evaluation  Patient identified by MRN, date of birth, ID band Patient awake    Reviewed: Allergy & Precautions, NPO status , Patient's Chart, lab work & pertinent test results  History of Anesthesia Complications Negative for: history of anesthetic complications  Airway Mallampati: III   Neck ROM: Full    Dental  (+) Missing, Chipped   Pulmonary neg pulmonary ROS   Pulmonary exam normal breath sounds clear to auscultation       Cardiovascular hypertension, Normal cardiovascular exam Rhythm:Regular Rate:Normal  ECG 09/29/24:  Normal sinus rhythm Nonspecific T wave abnormality Abnormal ECG When compared with ECG of 21-Apr-2007 16:10, No significant change was found   Neuro/Psych  Headaches PSYCHIATRIC DISORDERS  Depression       GI/Hepatic negative GI ROS,,,  Endo/Other    Class 3 obesity  Renal/GU negative Renal ROS     Musculoskeletal   Abdominal   Peds  Hematology  (+) Blood dyscrasia, anemia   Anesthesia Other Findings   Reproductive/Obstetrics                              Anesthesia Physical Anesthesia Plan  ASA: 3  Anesthesia Plan: General   Post-op Pain Management:    Induction: Intravenous  PONV Risk Score and Plan: 3 and Ondansetron , Dexamethasone and Treatment may vary due to age or medical condition  Airway Management Planned: Oral ETT  Additional Equipment:   Intra-op Plan:   Post-operative Plan: Extubation in OR  Informed Consent: I have reviewed the patients History and Physical, chart, labs and discussed the procedure including the risks, benefits and alternatives for the proposed anesthesia with the patient or authorized representative who has indicated his/her understanding and acceptance.     Dental advisory given  Plan Discussed with: CRNA  Anesthesia Plan Comments: (Patient consented for risks of anesthesia including but not limited to:  -  adverse reactions to medications - damage to eyes, teeth, lips or other oral mucosa - nerve damage due to positioning  - sore throat or hoarseness - damage to heart, brain, nerves, lungs, other parts of body or loss of life  Informed patient about role of CRNA in peri- and intra-operative care.  Patient voiced understanding.)         Anesthesia Quick Evaluation

## 2024-10-01 NOTE — Op Note (Signed)
 Procedure Date:  10/01/2024  Pre-operative Diagnosis:  Left breast cancer  Post-operative Diagnosis: Left breast cancer  Procedure: Left breast SAVI tag-localized lumpectomy and sentinel lymph node biopsy  Surgeon:  Aloysius Sheree Plant, MD  Anesthesia:  General endotracheal  Estimated Blood Loss: 25 ml  Specimens:   1.  Left axillary sentinel lymph node #1, count 1143. 2.  Left breast mass. 3.  Left breast new medial margin, possible lymph node.  Complications: None  Operation performed with curative intent:Yes  Tracer(s) used to identify sentinel nodes in the upfront surgery (non-neoadjuvant) setting (select all that apply):Dye and Radioactive Tracer  Tracer(s) used to identify sentinel nodes in the neoadjuvant setting (select all that apply):N/A  All nodes (colored or non-colored) present at the end of a dye-filled lymphatic channel were removed:Yes   All significantly radioactive nodes were removed:Yes  All palpable suspicious nodes were removed:Yes  Biopsy-proven positive nodes marked with clips prior to chemotherapy were identified and removed:N/A  Indications for Procedure:  This is a 58 y.o. female who presents with left breast cancer.  The risks of bleeding, infection, injury to surrounding structures, hematoma, seroma, open wound, cosmetic deformity, and the need for further surgery were all discussed with the patient and was willing to proceed.  Prior to this procedure, the patient had undergone RF tag localization and sentinel lymphoscintigraphy.  Description of Procedure: The patient was correctly identified in the preoperative area and brought into the operating room.  The patient was placed supine with VTE prophylaxis in place.  Appropriate time-outs were performed.  Anesthesia was induced and the patient was intubated.  Appropriate antibiotics were infused.  A visual dye was injected in the left periareolar region under aseptic conditions. The left chest and  axilla were prepped and draped in usual sterile fashion.  Then using the hand-held probe an area of high counts was identified in the axilla, and a 5 cm incision was made.  Cautery was used to dissect down the subcutaneous tissue and the hand-held probe was used to guide dissection. A hot and blue lymph node was identified and resected.  This had a count of 1143.  No additional lymph nodes were palpable or identified.  The wound bed had a count of 25.  The cavity was irrigated and hemostasis was assured with electrocautery.  Local anesthetic was infiltrated into the skin and subcutaneous tissue of the cavity.  The wound was then closed in multiple layers with 3-0 Vicryl and 4-0 Monocryl and sealed with DermaBond.  Attention was turned to the [facility] tag localization sites which was determined using the [place] scout probe.  An incision was made overlying the 2 tags and 2 clips.  Scout probe was used to guide our dissection using electrocautery, and a partial mastectomy was performed with adequate margins.  MarginMarker was used to ink each of the sides of the specimen.  The specimen was then imaged to confirm that the area of concern, biopsy clips, and Savi tags were included in the excision.  This was then sent to pathology.  After this, the cavity was palpated and a small nodule was palpable in the medial margin.  As a precaution, we obtained a new medial margin and this may potentially be a small lymph node.  This was also labeled with margin marker and each of the sites.  The cavity was irrigated and hemostasis was assured with electrocautery.  Local anesthetic was infiltrated into the skin and subcutaneous tissue of the cavity.  The wound was  then closed in multiple layers with 2-0 Vicryl, 3-0 Vicryl and 4-0 Monocryl and sealed with DermaBond.  The patient was emerged from anesthesia and extubated and brought to the recovery room for further management.  The patient tolerated the procedure well and all counts  were correct at the end of the case.   Aloysius Sheree Plant, MD

## 2024-10-01 NOTE — Transfer of Care (Signed)
 Immediate Anesthesia Transfer of Care Note  Patient: Vickie Gutierrez  Procedure(s) Performed: BREAST LUMPECTOMY WITH RADIO FREQUENCY LOCALIZER (Left) BIOPSY, LYMPH NODE, SENTINEL, AXILLARY (Left)  Patient Location: PACU  Anesthesia Type:General  Level of Consciousness: awake  Airway & Oxygen Therapy: Patient Spontanous Breathing and Patient connected to nasal cannula oxygen  Post-op Assessment: Report given to RN and Post -op Vital signs reviewed and stable  Post vital signs: Reviewed and stable  Last Vitals:  Vitals Value Taken Time  BP 165/71 10/01/24 16:30  Temp 35.9 C 10/01/24 16:29  Pulse 74 10/01/24 16:39  Resp    SpO2 93 % 10/01/24 16:39  Vitals shown include unfiled device data.  Last Pain:  Vitals:   10/01/24 1629  TempSrc:   PainSc: Asleep         Complications: No notable events documented.

## 2024-10-01 NOTE — Anesthesia Procedure Notes (Signed)
 Procedure Name: Intubation Date/Time: 10/01/2024 1:46 PM  Performed by: Brien Sotero PARAS, CRNAPre-anesthesia Checklist: Patient identified, Patient being monitored, Timeout performed, Emergency Drugs available and Suction available Patient Re-evaluated:Patient Re-evaluated prior to induction Oxygen Delivery Method: Circle system utilized Preoxygenation: Pre-oxygenation with 100% oxygen Induction Type: IV induction Ventilation: Mask ventilation without difficulty Laryngoscope Size: Mac and 3 Grade View: Grade I Tube type: Oral Tube size: 7.0 mm Number of attempts: 1 Airway Equipment and Method: Stylet Placement Confirmation: ETT inserted through vocal cords under direct vision, positive ETCO2 and breath sounds checked- equal and bilateral Secured at: 22 cm Tube secured with: Tape Dental Injury: Teeth and Oropharynx as per pre-operative assessment

## 2024-10-01 NOTE — Discharge Instructions (Signed)
 Discharge instructions: 1.  Patient may shower, but do not scrub wounds heavily and dab dry only. 2.  Do not submerge wounds in pool/tub until fully healed. 3.  Do not apply ointments or hydrogen peroxide to the wounds. 4.  May apply ice packs to the wounds for comfort. 5.  Please wear breast binder at all times for the next 2 weeks.  May remove for showers or washing. 6.  May apply fluffed gauze over the incisions to help with padding/comfort. 7.  Do not drive while taking narcotics for pain control.  Prior to driving, make sure you are able to rotate right and left to look at blindspots without significant pain or discomfort. 8.  Avoid strenuous activity with the left arm for the next 2 weeks.

## 2024-10-01 NOTE — Interval H&P Note (Signed)
 History and Physical Interval Note:  10/01/2024 1:11 PM  Vickie Gutierrez  has presented today for surgery, with the diagnosis of Invasive ductal carcinoma of left breast.  The various methods of treatment have been discussed with the patient and family. After consideration of risks, benefits and other options for treatment, the patient has consented to  Procedures: BREAST LUMPECTOMY WITH RADIO FREQUENCY LOCALIZER (Left) BIOPSY, LYMPH NODE, SENTINEL, AXILLARY (Left) as a surgical intervention.  The patient's history has been reviewed, patient examined, no change in status, stable for surgery.  I have reviewed the patient's chart and labs.  Questions were answered to the patient's satisfaction.     Simone Rodenbeck

## 2024-10-02 ENCOUNTER — Encounter: Payer: Self-pay | Admitting: Surgery

## 2024-10-06 ENCOUNTER — Other Ambulatory Visit: Payer: Self-pay | Admitting: Pathology

## 2024-10-06 ENCOUNTER — Encounter: Payer: Self-pay | Admitting: *Deleted

## 2024-10-06 LAB — SURGICAL PATHOLOGY

## 2024-10-06 NOTE — Progress Notes (Addendum)
 Oncotype Dx order [id number] submitted online.  Also faxed request to add ER/PR/Her2 to block 2 (invasive lobular specimen) on breast biopsy performed 11/13.

## 2024-10-07 ENCOUNTER — Inpatient Hospital Stay: Attending: Oncology | Admitting: Hospice and Palliative Medicine

## 2024-10-07 DIAGNOSIS — C50912 Malignant neoplasm of unspecified site of left female breast: Secondary | ICD-10-CM

## 2024-10-07 DIAGNOSIS — C50412 Malignant neoplasm of upper-outer quadrant of left female breast: Secondary | ICD-10-CM | POA: Insufficient documentation

## 2024-10-07 DIAGNOSIS — Z170 Estrogen receptor positive status [ER+]: Secondary | ICD-10-CM | POA: Insufficient documentation

## 2024-10-07 NOTE — Progress Notes (Signed)
 Multidisciplinary Oncology Council Documentation  Vickie Gutierrez was presented by our [facility] on 10/07/2024, which included representatives from:  Palliative Care Dietitian  Physical/Occupational Therapist Nurse Navigator Genetics Social work Survivorship RN Financial Navigator Research RN   Vickie Vickie currently presents with history of breast cancer  We reviewed previous medical and familial history, history of present illness, and recent lab results along with all available histopathologic and imaging studies. The MOC considered available treatment options and made the following recommendations/referrals:  SW  The MOC is a meeting of clinicians from various specialty areas who evaluate and discuss patients for whom a multidisciplinary approach is being considered. Final determinations in the plan of care are those of the provider(s).   Today's extended care, comprehensive team conference, Vickie Vickie was not present for the discussion and was not examined.

## 2024-10-08 ENCOUNTER — Inpatient Hospital Stay: Attending: Oncology

## 2024-10-08 NOTE — Progress Notes (Signed)
 [facility] Clinical Social Work  Initial Assessment   Vickie Gutierrez is a 58 y.o. year old female contacted by phone. Clinical Social Work was referred by [facility] NP for assessment of psychosocial needs.   SDOH (Social Determinants of Health) assessments performed: No SDOH Interventions    Flowsheet Row Office Visit from 08/17/2024 in [facility]  SDOH Interventions   Depression Interventions/Treatment  PHQ2-9 Score <4 Follow-up Not Indicated    SDOH Screenings   Food Insecurity: Patient Declined (10/03/2023)   Received from [facility]  Housing: Patient Declined (10/03/2023)   Received from [facility]  Transportation Needs: Patient Declined (10/03/2023)   Received from [facility]  Utilities: Patient Declined (10/03/2023)   Received from [facility]  Alcohol Screen: Low Risk (09/09/2024)  Depression (PHQ2-9): High Risk (08/17/2024)  Financial Resource Strain: Patient Declined (10/03/2023)   Received from [facility]  Physical Activity: Inactive (06/27/2022)   Received from [facility]  Social Connections: Unknown (07/08/2023)   [facility]  Stress: No Stress Concern Present (06/27/2022)   Received from [facility]  Tobacco Use: Low Risk (10/01/2024)    PHQ 2/9:    08/17/2024    4:09 PM 06/26/2017    2:49 PM 05/09/2017   10:46 AM  Depression screen PHQ 2/9  Decreased Interest 2 0 2  Down, Depressed, Hopeless 2 1 2   PHQ - 2 Score 4 1 4   Altered sleeping 0 1 3  Tired, decreased energy 2 3 3   Change in appetite 3 1 1   Feeling bad or failure about yourself  2 0 3  Trouble concentrating 0  1  Moving slowly or fidgety/restless 0 0 1  Suicidal thoughts 0 0 2  PHQ-9 Score 11  6  18    Difficult doing work/chores Not difficult at all Somewhat difficult Extremely dIfficult     Data saved with a previous flowsheet row definition     Distress Screen  completed: No     No data to display            Family/Social Information:  Housing Arrangement: patient lives alone Family members/support persons in your life? Family- three adult children live nearby. Transportation concerns: no, patient's daughter is currently able to provide transportation. Employment: Working full time at [employer]. Patient reports work has been supportive during Dx/ Tx so far  Income source: Educational Psychologist concerns: Yes, due to illness and/or loss of work during treatment Type of concern: Utilities, Government social research officer, and Food Food access concerns: yes, patient reports currently having enough food but is concerned that lost income due to illness will make paying for food difficult. Religious or spiritual practice: No Advanced directives: No Services Currently in place:  [employer]  Coping/ Adjustment to diagnosis: Patient understands treatment plan and what happens next? yes, patient looks forward to follow up with oncology providers on 10/27/24 post surgery to find out if adjuvant treatment is necessary. Concerns about diagnosis and/or treatment: Losing my job and/or losing income, Science Writer by information, and Afraid of cancer Patient reported stressors: Work/ school, Actuary, Arts Administrator, and Adjusting to my illness Current coping skills/ strengths: Ability for insight , Average or above average intelligence , Capable of independent living , Motivation for treatment/growth , Supportive family/friends , and Work skills     SUMMARY: Current SDOH Barriers:  Patient is concerned about future Financial constraints related to lost time at  work  Clinical Social Work Goal(s):  Patient will work with SW to address concerns related to resource needs.  Interventions: Discussed common feeling and emotions when being diagnosed with cancer, and the importance of support during treatment Informed patient of the support team roles and  support services at [facility] Provided CSW contact information and encouraged patient to call with any questions or concerns Patient to pick up grocery bag and [employer] gift card at next [facility] appointment on 12/31. Intern also shared information about local food pantries and [employer] [facility] [place]. If financial need increases due to time lost at work, patient will alert CSW.   Follow Up Plan: CSW will follow-up with patient by phone  Patient verbalizes understanding of plan: Yes    Thersia KATHEE Daring Clinical Social Work Intern [facility]

## 2024-10-09 ENCOUNTER — Encounter: Payer: Self-pay | Admitting: Oncology

## 2024-10-11 NOTE — Anesthesia Postprocedure Evaluation (Signed)
"   Anesthesia Post Note  Patient: Zsa Zsa M Kondracki  Procedure(s) Performed: BREAST LUMPECTOMY WITH RADIO FREQUENCY LOCALIZER (Left) BIOPSY, LYMPH NODE, SENTINEL, AXILLARY (Left)  Patient location during evaluation: PACU Anesthesia Type: General Level of consciousness: awake and alert Pain management: pain level controlled Vital Signs Assessment: post-procedure vital signs reviewed and stable Respiratory status: spontaneous breathing, nonlabored ventilation, respiratory function stable and patient connected to nasal cannula oxygen Cardiovascular status: blood pressure returned to baseline and stable Postop Assessment: no apparent nausea or vomiting Anesthetic complications: no   No notable events documented.   Last Vitals:  Vitals:   10/01/24 1745 10/01/24 1811  BP: (!) 178/82 (!) 156/73  Pulse: 68 62  Resp: 16 16  Temp:  (!) 36.1 C  SpO2: 98% 100%    Last Pain:  Vitals:   10/01/24 1836  TempSrc:   PainSc: 2                  Prentice Murphy      "

## 2024-10-12 ENCOUNTER — Inpatient Hospital Stay

## 2024-10-15 ENCOUNTER — Other Ambulatory Visit: Payer: Self-pay | Admitting: Family Medicine

## 2024-10-15 DIAGNOSIS — M25561 Pain in right knee: Secondary | ICD-10-CM

## 2024-10-19 ENCOUNTER — Ambulatory Visit: Admitting: Surgery

## 2024-10-19 ENCOUNTER — Encounter: Payer: Self-pay | Admitting: Surgery

## 2024-10-19 VITALS — BP 144/88 | HR 89 | Temp 98.2°F | Ht 65.0 in | Wt 270.8 lb

## 2024-10-19 DIAGNOSIS — Z08 Encounter for follow-up examination after completed treatment for malignant neoplasm: Secondary | ICD-10-CM

## 2024-10-19 DIAGNOSIS — C50412 Malignant neoplasm of upper-outer quadrant of left female breast: Secondary | ICD-10-CM

## 2024-10-19 DIAGNOSIS — C50912 Malignant neoplasm of unspecified site of left female breast: Secondary | ICD-10-CM

## 2024-10-19 DIAGNOSIS — Z09 Encounter for follow-up examination after completed treatment for conditions other than malignant neoplasm: Secondary | ICD-10-CM

## 2024-10-19 NOTE — Patient Instructions (Signed)
 Lumpectomy, Care After The following information offers guidance on how to care for yourself after your procedure. Your health care provider may also give you more specific instructions. If you have problems or questions, contact your health care provider. What can I expect after the procedure? After the procedure, it is common to have: Some pain or redness at the incision site. Breast swelling. Breast tenderness. Stiffness in your arm or shoulder. A change in the shape and feel of your breast. Scar tissue that feels hard to the touch in the area where the lump was removed. Follow these instructions at home: Medicines Take over-the-counter and prescription medicines only as told by your health care provider. If you were prescribed an antibiotic, take it as told by your health care provider. Do not stop taking the antibiotic even if you start to feel better. Ask your health care provider if the medicine prescribed to you: Requires you to avoid driving or using machinery. Can cause constipation. You may need to take these actions to prevent or treat constipation: Drink enough fluid to keep your urine pale yellow. Take over-the-counter or prescription medicines. Eat foods that are high in fiber, such as beans, whole grains, and fresh fruits and vegetables. Limit foods that are high in fat and processed sugars, such as fried or sweet foods. Incision care     Follow instructions from your health care provider about how to take care of your incision. Make sure you: Wash your hands with soap and water for at least 20 seconds before and after you change your bandage (dressing). If soap and water are not available, use hand sanitizer. Change your dressing as told by your health care provider. Leave stitches (sutures), skin glue, or adhesive strips in place. These skin closures may need to stay in place for 2 weeks or longer. If adhesive strip edges start to loosen and curl up, you may trim the  loose edges. Do not remove adhesive strips completely unless your health care provider tells you to do that. Check your incision area every day for signs of infection. Check for: More redness, swelling, or pain. Fluid or blood. Warmth. Pus or a bad smell. Keep your dressing clean and dry. If you were sent home with a surgical drain in place, follow instructions from your health care provider about emptying it. Bathing Do not take baths, swim, or use a hot tub until your health care provider approves. Ask your health care provider if you may take showers. You may only be allowed to take sponge baths. Activity Rest as told by your health care provider. Do not sit for a long time without moving. Get up to take short walks every 1-2 hours. This will improve blood flow and breathing. Ask for help if you feel weak or unsteady. Be careful to avoid any activities that could cause an injury to your arm on the side of your surgery. Do not lift anything that is heavier than 10 lb (4.5 kg), or the limit that you are told, until your health care provider says that it is safe. Avoid lifting with the arm that is on the side of your surgery. Do not carry heavy objects on your shoulder on the side of your surgery. Do exercises to keep your shoulder and arm from getting stiff and swollen. Talk with your health care provider about which exercises are safe for you. Return to your normal activities as told by your health care provider. Ask your health care provider what activities  are safe for you. General instructions Wear a supportive bra as told by your health care provider. Raise (elevate) your arm above the level of your heart while you are sitting or lying down. Do not wear tight jewelry on your arm, wrist, or fingers on the side of your surgery. Wear compression stockings as told by your health care provider. These stockings help to prevent blood clots and reduce swelling in your legs. If you had any lymph  nodes removed during your procedure, be sure to tell all of your health care providers. It is important to share this information before you have certain procedures, such as blood tests or blood pressure measurements. Keep all follow-up visits. You may need to be screened for extra fluid around the lymph nodes and swelling in the breast and arm (lymphedema). Contact a health care provider if: You develop a rash. You have a fever. Your pain worsens or pain medicine is not working. You have swelling, weakness, or numbness in your arm that does not improve after a few weeks. You have new swelling in your breast. You have any of these signs of infection: More redness, swelling, or pain in your incision area. Fluid or blood coming from your incision. Warmth coming from the incision area. Pus or a bad smell coming from your incision. Get help right away if: You have very bad pain in your breast or arm. You have swelling in your legs or arms. You have redness, warmth, or pain in your leg or arm. You have chest pain. You have difficulty breathing. These symptoms may be an emergency. Get help right away. Call 911. Do not wait to see if the symptoms will go away. Do not drive yourself to the hospital. Summary After the procedure, it is common to have breast tenderness, swelling in your breast, and stiffness in your arm and shoulder. Follow instructions from your health care provider about how to take care of your incision. Do not lift anything that is heavier than 10 lb (4.5 kg), or the limit that you are told, until your health care provider says that it is safe. Avoid lifting with the arm that is on the side of your surgery. If you had any lymph nodes removed during your procedure, be sure to tell all of your health care providers. This information is not intended to replace advice given to you by your health care provider. Make sure you discuss any questions you have with your health care  provider. Document Revised: 12/17/2021 Document Reviewed: 12/17/2021 Elsevier Patient Education  2024 [employer].

## 2024-10-19 NOTE — Progress Notes (Signed)
 10/19/2024  HPI: Vickie Gutierrez is a 58 y.o. female s/p left breast lumpectomy and sentinel lymph node biopsy on 10/01/2024.  Patient presents today for follow-up.  Patient reports that she is still feeling a sensation of pulling or pain in the left breast particular if she removes the breast binder.  She feels much better with the binder back on.  Denies any troubles with the incisions themselves.  Vital signs: BP (!) 144/88   Pulse 89   Temp 98.2 F (36.8 C) (Oral)   Ht 5' 5 (1.651 m)   Wt 270 lb 12.8 oz (122.8 kg)   LMP 10/22/2012 Comment: no periods.   SpO2 98%   BMI 45.06 kg/m    Physical Exam: Constitutional: No acute distress Breast: Left breast status post lateral lumpectomy and sentinel lymph node biopsy.  Incisions are healing well and are clean, dry, intact.  There is palpable firmness at the lumpectomy site consistent with scar tissue and likely some seroma formation.  No evidence of infection.  No left axillary lymphadenopathy.  Breast binder reapplied.  Assessment/Plan: This is a 58 y.o. female s/p left breast lumpectomy and sentinel lymph node biopsy  - Discussed with the patient that her symptoms are more likely due to the scarring and irritation that is happening as a result of the surgery at the 2 incisions.  Using the breast binder or at a minimum a tighter sports bra will be helpful to give more support to the breast for the weight of the breast post less on the scarring in the tissues.  This will keep improving with time. - Discussed with her again pathology results.  Margins were all negative and her lymph node removed was negative.  Oncotype score is still pending.  Patient has follow-up with Dr. Jacobo and Dr. Lenn next week. - Follow-up with me in 6 months.   Aloysius Sheree Plant, MD [facility]

## 2024-10-21 ENCOUNTER — Encounter: Payer: Self-pay | Admitting: Oncology

## 2024-10-21 ENCOUNTER — Inpatient Hospital Stay: Admitting: Occupational Therapy

## 2024-10-21 DIAGNOSIS — C50912 Malignant neoplasm of unspecified site of left female breast: Secondary | ICD-10-CM

## 2024-10-21 DIAGNOSIS — Z170 Estrogen receptor positive status [ER+]: Secondary | ICD-10-CM | POA: Diagnosis not present

## 2024-10-21 DIAGNOSIS — C50412 Malignant neoplasm of upper-outer quadrant of left female breast: Secondary | ICD-10-CM | POA: Diagnosis present

## 2024-10-21 NOTE — Therapy (Signed)
 " OUTPATIENT OCCUPATIONAL THERAPY BREAST CANCER POSTOP TREATMENT   Patient Name: Vickie Gutierrez MRN: [id number] DOB:1966-08-10, 58 y.o., female Today's Date: 10/21/2024  END OF SESSION:  OT End of Session - 10/21/24 0911     Visit Number 2    Number of Visits 6    Date for Recertification  12/23/24    OT Start Time 0800    OT Stop Time 0835    OT Time Calculation (min) 35 min    Activity Tolerance Patient tolerated treatment well    Behavior During Therapy WFL for tasks assessed/performed          Past Medical History:  Diagnosis Date   Allergy    seasonal   Anemia    Depression    Hypertension    no medications   Invasive ductal carcinoma of left breast (HCC)    Migraines    Morbid obesity (HCC)    Past Surgical History:  Procedure Laterality Date   AXILLARY SENTINEL NODE BIOPSY Left 10/01/2024   Procedure: BIOPSY, LYMPH NODE, SENTINEL, AXILLARY;  Surgeon: Desiderio Schanz, MD;  Location: [facility];  Service: General;  Laterality: Left;   BREAST BIOPSY Left 09/03/2024   US  LT BREAST BX W LOC DEV 1ST LESION IMG BX SPEC US  GUIDE 09/03/2024 [facility]   BREAST BIOPSY Left 09/03/2024   MM LT BREAST BX W LOC DEV 1ST LESION IMAGE BX SPEC STEREO GUIDE 09/03/2024 [facility]   BREAST BIOPSY  09/24/2024   MM LT BREAST SAVI/RF TAG 1ST LESION MAMMO GUIDE 09/24/2024 [facility]   BREAST BIOPSY  09/24/2024   MM LT BREAST SAVI/RF TAG EA ADD'L LESION MAMMO GUIDE 09/24/2024 [facility]   BREAST LUMPECTOMY WITH RADIO FREQUENCY LOCALIZER Left 10/01/2024   Procedure: BREAST LUMPECTOMY WITH RADIO FREQUENCY LOCALIZER;  Surgeon: Desiderio Schanz, MD;  Location: [facility];  Service: General;  Laterality: Left;   carpel tunnel Right    COLONOSCOPY WITH PROPOFOL  N/A 04/05/2023   Procedure: COLONOSCOPY WITH PROPOFOL ;  Surgeon: Onita Elspeth Sharper, DO;  Location: [facility];  Service: Gastroenterology;  Laterality: N/A;   TONSILLECTOMY     TUBAL LIGATION     Patient  Active Problem List   Diagnosis Date Noted   Invasive ductal carcinoma of left breast (HCC) 09/09/2024   Elevated blood pressure reading 05/09/2017   Reactive depression 05/09/2017   BMI 40.0-44.9, adult (HCC) 01/08/2017   Environmental and seasonal allergies 01/08/2017   Migraine without status migrainosus, not intractable 01/08/2017   Arthralgia of right knee 01/08/2017    PCP: Dr Sharma  REFERRING PROVIDER: Dr Jacobo  REFERRING DIAG: L breast Cancer with lumpectomy 10/01/24  THERAPY DIAG:  Invasive ductal carcinoma of left breast [facility])  Rationale for Evaluation and Treatment: Rehabilitation  ONSET DATE: 10/01/24 lumpectomy  SUBJECTIVE:  SUBJECTIVE STATEMENT: I had my lumpectomy.  Still need to wear my compression.  The scar is just hard and tender.  And then I feel a funny feeling in my L hand.  Periwound states pins-and-needles.  But the doctor said it is maybe the way I was position. PERTINENT HISTORY:  Dr Desiderio, 09/21/24 visit  Assessment and Plan: This is a 58 y.o. female with left breast cancer.   -Discussed with the patient the findings on her imaging and biopsy results.  She has two areas within the upper outer quadrant of the left breast which are positive for cancer.  Discussed with the patient some of the risk factors associated with breast cancer.  Discussed with her the potential surgical options including lumpectomy versus mastectomy, with evaluation of the left axillary lymph nodes via sentinel lymph node biopsy.  Discussed with her the recommendation for radiation if we do lumpectomy versus likely no radiation if we do mastectomy.  Discussed with her that lumpectomy or mastectomy would not affect the need for doing a sentinel lymph node biopsy as this is done for staging  purposes given her invasive cancer.  After further discussion, the patient has opted to proceed with lumpectomy and sentinel lymph node biopsy, followed by radiation. -Discussed with patient and the plan for a left breast tag localized lumpectomy of 2 sites with a left axillary sentinel lymph node biopsy.  Reviewed with her the surgery at length including preoperative localization at the [facility] breast center, planned incisions, risks of bleeding, infection, injury to surrounding structures, lymphedema, that this would be an outpatient procedure, use of radioactive tracer to identify the sentinel lymph nodes as well as the use of a blue dye to identify the sentinel lymph nodes, postoperative activity restrictions, pain control, and she is willing to proceed. -Patient will be scheduled for surgery on 10/01/2024.  All of her questions have been answered.   PATIENT GOALS:   reduce lymphedema risk and learn post op HEP.   PAIN:  Are you having pain?  Tenderness at the incision with scar massage  PRECAUTIONS: Active CA , left lymphedema precautions after surgery   HAND DOMINANCE: right  WEIGHT BEARING RESTRICTIONS: No  FALLS:  Has patient fallen in last 6 months? 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  Has a little dog.  Likes to travel with her daughter     OBJECTIVE:  COGNITION: Overall cognitive status: Within functional limits for tasks assessed    POSTURE:  rounded shoulders posture  UPPER EXTREMITY AROM/PROM: Bilateral shoulder active range of motion within normal limits.  Pain-free. CERVICAL AROM: All within normal limits:     UPPER EXTREMITY STRENGTH: Strength in bilateral shoulder in all range 5 -/5  LYMPHEDEMA ASSESSMENTS:   LANDMARK RIGHT   eval  10 cm proximal  to olecranon process   Olecranon process   10 cm proximal to ulnar styloid process   Just proximal to ulnar styloid process   Across hand at thumb web space   At base of 2nd digit   (Blank rows = not tested)  LANDMARK LEFT   eval  10 cm proximal to olecranon process   Olecranon process   10 cm proximal to ulnar styloid process   Just proximal to ulnar styloid process   Across hand  at thumb web space   At base of 2nd digit   (Blank rows = not tested)  L-DEX LYMPHEDEMA SCREENING:  The patient was assessed using the L-Dex machine today to produce a lymphedema index baseline score. The patient will be reassessed on a regular basis (typically every 3 months) to obtain new L-Dex scores. If the score is > 6.5 points away from his/her baseline score indicating onset of subclinical lymphedema, it will be recommended to wear a compression garment for 4 weeks, 12 hours per day and then be reassessed. If the score continues to be > 6.5 points from baseline at reassessment, we will initiate lymphedema treatment. Assessing in this manner has a 95% rate of preventing clinically significant lymphedema.   L-DEX FLOWSHEETS - 10/21/24 0900       L-DEX LYMPHEDEMA SCREENING   Measurement Type Unilateral    L-DEX MEASUREMENT EXTREMITY Upper Extremity    POSITION  Standing    DOMINANT SIDE Right    At Risk Side Left    BASELINE SCORE (UNILATERAL) -1    L-DEX SCORE (UNILATERAL) -1.2    VALUE CHANGE (UNILAT) -0.2         L-Dex score within normal range Session today: Patient rife with tomorrow being 3 weeks post op left lumpectomy.  Patient's scar tender with some increase scar tissue.  Educated patient on scar massage and mobilization to 3 times a day. Also reviewed with patient some active assisted range of motion for shoulder flexion and abduction on the wall 10 reps twice a day especially prior to radiation.  If she is can have radiation. Patient external rotation within normal range.  With no  increase symptoms. Also reviewed with patient and add for her to perform 2-3 times a day ulnar nerve glide to the site 5 reps And composite nerve glide walking down the bathroom 5 reps.  Patient was able to get in radiation position with ease. Reviewed with patient again lymphedema signs and symptoms and precautions.  Handout provided and reviewed with her at the evaluation. Provided patient with a bracelet As well as L-Dex score done and within normal range.  PATIENT EDUCATION:  Education details: Lymphedema risk reduction and post op shoulder/posture HEP Person educated: Patient Education method: Explanation, Demonstration, Handout Education comprehension: Patient verbalized understanding and returned demonstration    ASSESSMENT:  CLINICAL IMPRESSION: Patient presented at Occupational Therapy - s/p left lumpectomy.  With possible radiation.  Patient is active range of motion within normal range.  With some increased tenderness at both scars on the left breast with increased scar tissue and scar adhesion.  Patient was educated on scar massage.  Patient was also educated and reviewed active assisted range of motion for shoulder flexion and abduction to be performed daily and especially if doing radiation prior.  To maintain her range of motion.  Patient was also provided with composite nerve glide and ulnar nerve glide.  Patient's L-Dex score today was within normal range.  Patient can follow-up with breast navigator after radiation or in 3 months to repeat. L-Dex screens every 3 months for 2 years to detect subclinical lymphedema.  Pt will benefit from skilled therapeutic intervention to improve on the following deficits: Decreased knowledge of precautions and lymphedema education, impaired UE functional use, pain, decreased ROM, postural dysfunction.   OT treatment/interventions: ADL/self-care home management, pt/family education, therapeutic exercise,manual therapy  REHAB POTENTIAL:  Good  CLINICAL DECISION MAKING: Stable/uncomplicated  EVALUATION COMPLEXITY: Low   GOALS: Goals reviewed with patient? YES  LONG TERM GOALS: (STG=LTG)    Name Target Date Goal status  1 Pt will be able to verbalize understanding of pertinent lymphedema risk reduction practices relevant to her dx specifically related to skin care.  Baseline:  No knowledge 12 weeks Met  2 Pt will be able to return demo and/or verbalize understanding of the post op HEP related to regaining shoulder ROM. Baseline:  No knowledge Today Achieved at eval       4 Pt will demo she has regained full shoulder ROM and function post operatively compared to baselines.  Baseline: See objective measurements taken today. 12 weeks Met    PLAN:  OT FREQUENCY/DURATION: EVAL and follow-up appointments as needed-12 weeks  PLAN FOR NEXT SESSION: will reassess 3 weeks post op to determine needs.    Occupational Therapy Information for After Breast Cancer Surgery/Treatment:  Lymphedema is a swelling condition that you may be at risk for in your arm if you have lymph nodes removed from the armpit area.  After a sentinel node biopsy, the risk is approximately 5-9% and is higher after an axillary node dissection.  There is treatment available for this condition and it is not life-threatening.  Contact your physician or occupational therapist with concerns. You may begin the 4 shoulder/posture exercises (see additional sheet) when permitted by your physician (typically a week after surgery).  If you have drains, you may need to wait until those are removed before beginning range of motion exercises.  A general recommendation is to not lift your arms above shoulder height until drains are removed.  These exercises should be done to your tolerance and gently.  This is not a no pain/no gain type of recovery so listen to your body and stretch into the range of motion that you can tolerate, stopping if you have pain.  If you are  having immediate reconstruction, ask your plastic surgeon about doing exercises as he or she may want you to wait. .  While undergoing any medical procedure or treatment, try to avoid blood pressure being taken or needle sticks from occurring on the arm on the side of cancer.   This recommendation begins after surgery and continues for the rest of your life.  This may help reduce your risk of getting lymphedema (swelling in your arm). An excellent resource for those seeking information on lymphedema is the National Lymphedema Network's web site. It can be accessed at www.lymphnet.org If you notice swelling in your hand, arm or breast at any time following surgery (even if it is many years from now), please contact your doctor or occupational therapist to discuss this.  Lymphedema can be treated at any time but it is easier for you if it is treated early on.  If you feel like your shoulder motion is not returning to normal in a reasonable amount of time, please contact your surgeon or occupational therapist.  [facility] [phone]. [address] [place], [place] [address]  Patient was instructed today in a home exercise program today for post op shoulder range of motion. These included active assist shoulder flexion in standing/supine, scapular retraction, wall walking/slides with shoulder abduction, and hands behind head external rotation in supine.  She was encouraged to do these 2-3 x day, holding 3 seconds and repeating 10 times when permitted by her physician/surgeon      Ancel Peters, OTR/L,CLT 10/21/2024, 9:13 AM   "

## 2024-10-27 ENCOUNTER — Encounter: Payer: Self-pay | Admitting: Radiation Oncology

## 2024-10-27 ENCOUNTER — Inpatient Hospital Stay: Attending: Oncology | Admitting: Oncology

## 2024-10-27 ENCOUNTER — Ambulatory Visit
Admission: RE | Admit: 2024-10-27 | Discharge: 2024-10-27 | Disposition: A | Discharge status: Home / Self Care | Source: Ambulatory Visit | Attending: Radiation Oncology | Admitting: Radiation Oncology

## 2024-10-27 ENCOUNTER — Encounter: Payer: Self-pay | Admitting: Oncology

## 2024-10-27 VITALS — BP 145/105 | HR 86 | Temp 97.3°F | Ht 65.0 in | Wt 272.0 lb

## 2024-10-27 DIAGNOSIS — C50912 Malignant neoplasm of unspecified site of left female breast: Secondary | ICD-10-CM | POA: Insufficient documentation

## 2024-10-27 DIAGNOSIS — Z8041 Family history of malignant neoplasm of ovary: Secondary | ICD-10-CM | POA: Insufficient documentation

## 2024-10-27 DIAGNOSIS — I10 Essential (primary) hypertension: Secondary | ICD-10-CM | POA: Insufficient documentation

## 2024-10-27 DIAGNOSIS — Z7984 Long term (current) use of oral hypoglycemic drugs: Secondary | ICD-10-CM | POA: Insufficient documentation

## 2024-10-27 DIAGNOSIS — Z170 Estrogen receptor positive status [ER+]: Secondary | ICD-10-CM | POA: Insufficient documentation

## 2024-10-27 DIAGNOSIS — F17210 Nicotine dependence, cigarettes, uncomplicated: Secondary | ICD-10-CM | POA: Insufficient documentation

## 2024-10-27 NOTE — Progress Notes (Signed)
 Patient had her surgery, she still has some discomfort when she has to wear the bra they gave her and when she goes to sleep at night. She feels like her left arm gets really tight.

## 2024-10-27 NOTE — Consult Note (Signed)
 " NEW PATIENT EVALUATION  Name: Vickie Gutierrez  MRN: [id number]  Date:   10/27/2024     DOB: 25-May-1966   This 59 y.o. female patient presents to the clinic for initial evaluation of pathologic stage Ia (pT1 cN0 M0) both invasive mammary and lobular carcinoma status post wide local excision and sentinel node biopsy ER/PR positive with low Oncotype Dx score  REFERRING PHYSICIAN: Simmons-Robinson, Makiera, MD  CHIEF COMPLAINT:  Chief Complaint  Patient presents with   Breast Cancer    DIAGNOSIS: The encounter diagnosis was Invasive ductal carcinoma of left breast (HCC).   PREVIOUS INVESTIGATIONS:  Mammograms ultrasound reviewed Clinical notes reviewed Pathology reports reviewed  HPI: Patient is a 59 year old female who presents with an abnormal mammogram of her left breast.  There was a mass at the 2 o'clock position 4 cm from the nipple measuring 1.5 cm which was confirmed on diagnostic mammogram.  There is also a nearby left breast 7 mm asymmetry seen on the MLO view also suspicious for malignancy.  She underwent ultrasound-guided biopsy of the 2 masses site 1 showed invasive mammary carcinoma.  Site 2 was invasive lobular carcinoma in situ intermediate nuclear grade overall grade 2.  She went have a wide local excision again showing invasive mammary carcinoma no special type with 1 sentinel lymph node negative for malignancy.  Tumor measured 1.8 cm was overall grade 2.  Lymphatic or vascular beige not identified.  Margins were clear but close at 0.1 mm for invasive carcinoma. She has done well postoperatively.  Her Oncotype DX score was 9 necessitating the need for systemic therapy.  Tumor again was ER/PR positive HER2/neu not overexpressed.  She is still somewhat sore from her surgery.  She states she has some slight swelling in her upper left arm although I cannot appreciate any subtle difference between the 2 upper extremities.  She is seen today for radiation oncology  consultation.  PLANNED TREATMENT REGIMEN: Deep inspiration breath-hold hypofractionated radiation therapy to her left breast  PAST MEDICAL HISTORY:  has a past medical history of Allergy, Anemia, Depression, Hypertension, Invasive ductal carcinoma of left breast (HCC), Migraines, and Morbid obesity (HCC).    PAST SURGICAL HISTORY:  Past Surgical History:  Procedure Laterality Date   AXILLARY SENTINEL NODE BIOPSY Left 10/01/2024   Procedure: BIOPSY, LYMPH NODE, SENTINEL, AXILLARY;  Surgeon: Desiderio Schanz, MD;  Location: [facility];  Service: General;  Laterality: Left;   BREAST BIOPSY Left 09/03/2024   US  LT BREAST BX W LOC DEV 1ST LESION IMG BX SPEC US  GUIDE 09/03/2024 [facility]   BREAST BIOPSY Left 09/03/2024   MM LT BREAST BX W LOC DEV 1ST LESION IMAGE BX SPEC STEREO GUIDE 09/03/2024 [facility]   BREAST BIOPSY  09/24/2024   MM LT BREAST SAVI/RF TAG 1ST LESION MAMMO GUIDE 09/24/2024 [facility]   BREAST BIOPSY  09/24/2024   MM LT BREAST SAVI/RF TAG EA ADD'L LESION MAMMO GUIDE 09/24/2024 [facility]   BREAST LUMPECTOMY WITH RADIO FREQUENCY LOCALIZER Left 10/01/2024   Procedure: BREAST LUMPECTOMY WITH RADIO FREQUENCY LOCALIZER;  Surgeon: Desiderio Schanz, MD;  Location: [facility];  Service: General;  Laterality: Left;   carpel tunnel Right    COLONOSCOPY WITH PROPOFOL  N/A 04/05/2023   Procedure: COLONOSCOPY WITH PROPOFOL ;  Surgeon: Onita Elspeth Sharper, DO;  Location: [facility] ENDOSCOPY;  Service: Gastroenterology;  Laterality: N/A;   TONSILLECTOMY     TUBAL LIGATION      FAMILY HISTORY: family history includes Diabetes in her maternal grandmother and mother; Hypertension in  her maternal grandmother and mother; Ovarian cancer in her mother.  SOCIAL HISTORY:  reports that she has never smoked. She has never used smokeless tobacco. She reports that she does not drink alcohol and does not use drugs.  ALLERGIES: Patient has no known allergies.  MEDICATIONS:  Current  Outpatient Medications  Medication Sig Dispense Refill   acetaminophen  (TYLENOL ) 500 MG tablet Take 2 tablets (1,000 mg total) by mouth every 6 (six) hours as needed for mild pain (pain score 1-3).     ibuprofen  (ADVIL ) 600 MG tablet Take 1 tablet (600 mg total) by mouth every 8 (eight) hours as needed for moderate pain (pain score 4-6). 60 tablet 1   meloxicam  (MOBIC ) 15 MG tablet Take 1 tablet (15 mg total) by mouth daily as needed for pain. Do Not take within 24 hours of ibuprofen /other NSAIDs 30 tablet 0   metFORMIN  (GLUCOPHAGE -XR) 500 MG 24 hr tablet Take 1 tablet (500 mg total) by mouth daily with breakfast. (Patient taking differently: Take 500 mg by mouth daily with breakfast.) 90 tablet 1   oxyCODONE  (OXY IR/ROXICODONE ) 5 MG immediate release tablet Take 1 tablet (5 mg total) by mouth every 4 (four) hours as needed for severe pain (pain score 7-10). 30 tablet 0   SUMAtriptan  (IMITREX ) 25 MG tablet Take 1 tablet (25 mg total) by mouth every 2 (two) hours as needed for migraine. May repeat in 2 hours if headache persists or recurs. 10 tablet 0   No current facility-administered medications for this encounter.    ECOG PERFORMANCE STATUS:  0 - Asymptomatic  REVIEW OF SYSTEMS: Patient denies any weight loss, fatigue, weakness, fever, chills or night sweats. Patient denies any loss of vision, blurred vision. Patient denies any ringing  of the ears or hearing loss. No irregular heartbeat. Patient denies heart murmur or history of fainting. Patient denies any chest pain or pain radiating to her upper extremities. Patient denies any shortness of breath, difficulty breathing at night, cough or hemoptysis. Patient denies any swelling in the lower legs. Patient denies any nausea vomiting, vomiting of blood, or coffee ground material in the vomitus. Patient denies any stomach pain. Patient states has had normal bowel movements no significant constipation or diarrhea. Patient denies any dysuria, hematuria  or significant nocturia. Patient denies any problems walking, swelling in the joints or loss of balance. Patient denies any skin changes, loss of hair or loss of weight. Patient denies any excessive worrying or anxiety or significant depression. Patient denies any problems with insomnia. Patient denies excessive thirst, polyuria, polydipsia. Patient denies any swollen glands, patient denies easy bruising or easy bleeding. Patient denies any recent infections, allergies or URI. Patient s visual fields have not changed significantly in recent time.   PHYSICAL EXAM: LMP 10/22/2012 Comment: no periods.  Left breast has 2 incisions which are well-healed no dominant masses noted in either breast no axillary or supraclavicular adenopathy is appreciated.  Well-developed well-nourished patient in NAD. HEENT reveals PERLA, EOMI, discs not visualized.  Oral cavity is clear. No oral mucosal lesions are identified. Neck is clear without evidence of cervical or supraclavicular adenopathy. Lungs are clear to A&P. Cardiac examination is essentially unremarkable with regular rate and rhythm without murmur rub or thrill. Abdomen is benign with no organomegaly or masses noted. Motor sensory and DTR levels are equal and symmetric in the upper and lower extremities. Cranial nerves II through XII are grossly intact. Proprioception is intact. No peripheral adenopathy or edema is identified. No motor or  sensory levels are noted. Crude visual fields are within normal range.  LABORATORY DATA: Pathology reports reviewed    RADIOLOGY RESULTS: Mammograms ultrasound reviewed compatible with above-stated findings   IMPRESSION: Stage Ia invasive mammary carcinoma of the left breast with a focus of invasive lobular carcinoma adjacent to that area status post wide local excision with clear but close margins ER/PR positive in 59 year old female  PLAN: At this time I have recommended deep inspiration breath-hold radiation therapy to her  left breast hypofractionated over 3 weeks.  Based on the close margin would also boost her scar another 1600 cGy using photon beam therapy.  Risks and benefits of treatment including skin reaction fatigue alteration of blood counts possible inclusion of superficial lung all were reviewed in detail with the patient.  I will allow some further healing and push that her simulation to next week.  Patient comprehends my recommendations well.  Patient also will be a candidate for endocrine therapy after completion of radiation.  I would like to take this opportunity to thank you for allowing me to participate in the care of your patient.SABRA Marcey Penton, MD         "

## 2024-10-27 NOTE — Progress Notes (Signed)
 " 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  Oncotype Dx score 9, low risk.  INTERVAL HISTORY: Patient returns to clinic today after her lumpectomy to discuss her final pathology results and treatment planning.  She continues to have mild breast tenderness and swelling, but otherwise feels well and tolerated her procedure without significant side effects.  She has no neurologic complaints.  She denies any recent fevers or illnesses.  She has a good appetite and denies weight loss she has no chest pain, shortness of breath, cough, or hemoptysis.  She denies any nausea, vomiting, constipation, or diarrhea.  She has no urinary complaints.  Patient offers no further specific complaints today.  REVIEW OF SYSTEMS:   Review of Systems  Constitutional: Negative.  Negative for fever, malaise/fatigue and weight loss.  Respiratory: Negative.  Negative for cough, hemoptysis and shortness of breath.   Cardiovascular: Negative.  Negative for chest pain and leg swelling.  Gastrointestinal: Negative.  Negative for abdominal pain.  Genitourinary: Negative.  Negative for dysuria.  Musculoskeletal: Negative.  Negative for back pain.  Skin: Negative.  Negative for rash.  Neurological: Negative.  Negative for dizziness, focal weakness, weakness and headaches.  Psychiatric/Behavioral: Negative.  The patient is not nervous/anxious.     As per HPI. Otherwise, a complete review of systems is negative.  PAST MEDICAL HISTORY: Past Medical History:  Diagnosis Date   Allergy    seasonal   Anemia    Depression     Hypertension    no medications   Invasive ductal carcinoma of left breast (HCC)    Migraines    Morbid obesity (HCC)     PAST SURGICAL HISTORY: Past Surgical History:  Procedure Laterality Date   AXILLARY SENTINEL NODE BIOPSY Left 10/01/2024   Procedure: BIOPSY, LYMPH NODE, SENTINEL, AXILLARY;  Surgeon: Desiderio Schanz, MD;  Location: [facility];  Service: General;  Laterality: Left;   BREAST BIOPSY Left 09/03/2024   US  LT BREAST BX W LOC DEV 1ST LESION IMG BX SPEC US  GUIDE 09/03/2024 [facility]   BREAST BIOPSY Left 09/03/2024   MM LT BREAST BX W LOC DEV 1ST LESION IMAGE BX SPEC STEREO GUIDE 09/03/2024 [facility]   BREAST BIOPSY  09/24/2024   MM LT BREAST SAVI/RF TAG 1ST LESION MAMMO GUIDE 09/24/2024 [facility]   BREAST BIOPSY  09/24/2024   MM LT BREAST SAVI/RF TAG EA ADD'L LESION MAMMO GUIDE 09/24/2024 [facility]   BREAST LUMPECTOMY WITH RADIO FREQUENCY LOCALIZER Left 10/01/2024   Procedure: BREAST LUMPECTOMY WITH RADIO FREQUENCY LOCALIZER;  Surgeon: Desiderio Schanz, MD;  Location: [facility];  Service: General;  Laterality: Left;   carpel tunnel Right    COLONOSCOPY WITH PROPOFOL  N/A 04/05/2023   Procedure: COLONOSCOPY WITH PROPOFOL ;  Surgeon: Onita Elspeth Sharper, DO;  Location: [facility];  Service: Gastroenterology;  Laterality: N/A;   TONSILLECTOMY     TUBAL LIGATION      FAMILY HISTORY: Family History  Problem Relation Age of Onset   Ovarian cancer Mother    Diabetes Mother    Hypertension Mother    Diabetes Maternal Grandmother    Hypertension Maternal Grandmother    Breast cancer Neg Hx  ADVANCED DIRECTIVES (Y/N):  N  HEALTH MAINTENANCE: Social History   Tobacco Use   Smoking status: Never   Smokeless tobacco: Never  Vaping Use   Vaping status: Never Used  Substance Use Topics   Alcohol use: No   Drug use: No     Colonoscopy:  PAP:  Bone density:  Lipid panel:  No Known Allergies  Current Outpatient Medications  Medication  Sig Dispense Refill   acetaminophen  (TYLENOL ) 500 MG tablet Take 2 tablets (1,000 mg total) by mouth every 6 (six) hours as needed for mild pain (pain score 1-3).     ibuprofen  (ADVIL ) 600 MG tablet Take 1 tablet (600 mg total) by mouth every 8 (eight) hours as needed for moderate pain (pain score 4-6). 60 tablet 1   meloxicam  (MOBIC ) 15 MG tablet Take 1 tablet (15 mg total) by mouth daily as needed for pain. Do Not take within 24 hours of ibuprofen /other NSAIDs 30 tablet 0   metFORMIN  (GLUCOPHAGE -XR) 500 MG 24 hr tablet Take 1 tablet (500 mg total) by mouth daily with breakfast. (Patient taking differently: Take 500 mg by mouth daily with breakfast.) 90 tablet 1   oxyCODONE  (OXY IR/ROXICODONE ) 5 MG immediate release tablet Take 1 tablet (5 mg total) by mouth every 4 (four) hours as needed for severe pain (pain score 7-10). 30 tablet 0   SUMAtriptan  (IMITREX ) 25 MG tablet Take 1 tablet (25 mg total) by mouth every 2 (two) hours as needed for migraine. May repeat in 2 hours if headache persists or recurs. 10 tablet 0   No current facility-administered medications for this visit.    OBJECTIVE: Vitals:   10/27/24 1024 10/27/24 1025  BP: (!) 124/108 (!) 145/105  Pulse: 86   Temp: (!) 97.3 F (36.3 C)   SpO2: 100%      Body mass index is 45.26 kg/m.    ECOG FS:0 - Asymptomatic  General: Well-developed, well-nourished, no acute distress. Eyes: Pink conjunctiva, anicteric sclera. HEENT: Normocephalic, moist mucous membranes. Lungs: No audible wheezing or coughing. Heart: Regular rate and rhythm. Abdomen: Soft, nontender, no obvious distention. Musculoskeletal: No edema, cyanosis, or clubbing. Neuro: Alert, answering all questions appropriately. Cranial nerves grossly intact. Skin: No rashes or petechiae noted. Psych: Normal affect.  LAB RESULTS:  Lab Results  Component Value Date   NA 138 09/29/2024   K 4.1 09/29/2024   CL 103 09/29/2024   CO2 25 09/29/2024   GLUCOSE 81 09/29/2024    BUN 17 09/29/2024   CREATININE 0.90 09/29/2024   CALCIUM 9.0 09/29/2024   PROT 7.3 05/09/2017   ALBUMIN 4.2 05/09/2017   AST 8 05/09/2017   ALT 9 05/09/2017   ALKPHOS 95 05/09/2017   BILITOT 0.2 05/09/2017   GFRNONAA >60 09/29/2024   GFRAA 73 05/09/2017    Lab Results  Component Value Date   WBC 8.5 09/29/2024   NEUTROABS 4.1 05/09/2017   HGB 12.5 09/29/2024   HCT 40.3 09/29/2024   MCV 89.0 09/29/2024   PLT 274 09/29/2024     STUDIES: MM Breast Surgical Specimen Result Date: 10/02/2024 CLINICAL DATA:  Status post [facility] localized LEFT breast lumpectomy. EXAM: SPECIMEN RADIOGRAPH OF THE LEFT BREAST COMPARISON:  Previous exam(s). FINDINGS: Status post excision of the LEFT breast. The [employer] (2 total), coil shaped clip, and Venus shaped clip are present within the specimen. IMPRESSION: Specimen radiograph of the LEFT breast. Electronically Signed   By: Norleen Croak M.D.   On: 10/02/2024 11:43   NM  Sentinel Node Inj-No Rpt (Breast) Result Date: 10/01/2024 Lymphoseek was injected by the Nuclear Medicine Technologist for sentinel lymph node localization.    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  Oncotype DX score 9, low risk.  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  Given her low risk Oncotype score, adjuvant chemotherapy is not necessary.  She has an appointment with radiation oncology today to discuss adjuvant XRT.  Once she completes XRT patient will be placed on letrozole for for total of 5 years.  Return to clinic at the end of her treatments for further evaluation and initiation of treatment.   Breast tenderness/swelling: Continue follow-up with surgery as indicated.   Hypertension: Chronic and unchanged.  Continue monitoring and treatment per  primary care.  I spent a total of 30 minutes reviewing chart data, face-to-face evaluation with the patient, counseling and coordination of care as detailed above.    Patient expressed understanding and was in agreement with this plan. She also understands that She can call clinic at any time with any questions, concerns, or complaints.    Cancer Staging  Invasive ductal carcinoma of left breast [facility]) Staging form: Breast, AJCC 8th Edition - Pathologic stage from 10/27/2024: Stage IA (pT1c, pN0, cM0, G2, ER+, PR+, HER2-, Oncotype DX score: 9) - Signed by Jacobo Evalene PARAS, MD on 10/27/2024 Multigene prognostic tests performed: Oncotype DX Recurrence score range: Less than 11 Histologic grading system: 3 grade system   Evalene PARAS Jacobo, MD   10/27/2024 10:50 AM     "

## 2024-11-02 ENCOUNTER — Inpatient Hospital Stay

## 2024-11-02 NOTE — Progress Notes (Signed)
 Tumor Board Documentation  Vickie Gutierrez was presented by Dr. Jacobo at our Tumor Board on 11/02/2024, which included representatives from medical oncology, radiation oncology, radiology, pathology.  Vickie Vickie currently presents as a current patient with history of the following treatments: surgical intervention(s).  Additionally, we reviewed previous medical and familial history, history of present illness, and recent lab results along with all available histopathologic and imaging studies. The tumor board considered available treatment options and made the following recommendations: Hormonal therapy, Adjuvant radiation    The following procedures/referrals were also placed: No orders of the defined types were placed in this encounter.   Clinical Trial Status: not discussed   Staging used: AJCC Staging: T: T1b N: N0        National site-specific guidelines   were discussed with respect to the case.  Tumor board is a meeting of clinicians from various specialty areas who evaluate and discuss patients for whom a multidisciplinary approach is being considered. Final determinations in the plan of care are those of the provider(s). The responsibility for follow up of recommendations given during tumor board is that of the provider.   Todays extended care, comprehensive team conference, Vickie Vickie was not present for the discussion and was not examined.   Multidisciplinary Tumor Board is a multidisciplinary case peer review process.  Decisions discussed in the Multidisciplinary Tumor Board reflect the opinions of the specialists present at the conference without having examined the patient.  Ultimately, treatment and diagnostic decisions rest with the primary provider(s) and the patient.

## 2024-11-05 ENCOUNTER — Ambulatory Visit
Admission: RE | Admit: 2024-11-05 | Discharge: 2024-11-05 | Disposition: A | Discharge status: Home / Self Care | Source: Ambulatory Visit | Attending: Radiation Oncology | Admitting: Radiation Oncology

## 2024-11-05 ENCOUNTER — Encounter: Payer: Self-pay | Admitting: *Deleted

## 2024-11-05 DIAGNOSIS — Z170 Estrogen receptor positive status [ER+]: Secondary | ICD-10-CM | POA: Diagnosis not present

## 2024-11-05 DIAGNOSIS — C50412 Malignant neoplasm of upper-outer quadrant of left female breast: Secondary | ICD-10-CM | POA: Diagnosis present

## 2024-11-05 DIAGNOSIS — Z510 Encounter for antineoplastic radiation therapy: Secondary | ICD-10-CM | POA: Insufficient documentation

## 2024-11-06 ENCOUNTER — Other Ambulatory Visit: Payer: Self-pay | Admitting: *Deleted

## 2024-11-06 DIAGNOSIS — C50412 Malignant neoplasm of upper-outer quadrant of left female breast: Secondary | ICD-10-CM | POA: Diagnosis not present

## 2024-11-06 DIAGNOSIS — C50912 Malignant neoplasm of unspecified site of left female breast: Secondary | ICD-10-CM

## 2024-11-12 ENCOUNTER — Ambulatory Visit
Admission: RE | Admit: 2024-11-12 | Discharge: 2024-11-12 | Disposition: A | Discharge status: Home / Self Care | Source: Ambulatory Visit | Attending: Radiation Oncology | Admitting: Radiation Oncology

## 2024-11-12 DIAGNOSIS — C50412 Malignant neoplasm of upper-outer quadrant of left female breast: Secondary | ICD-10-CM | POA: Diagnosis not present

## 2024-11-13 ENCOUNTER — Telehealth: Payer: Self-pay | Admitting: Surgery

## 2024-11-13 NOTE — Telephone Encounter (Signed)
 Pt said that she is wanting to know if she could get a refill on her medication but also maybe increase due to she is having severe pain most of the time. Sometimes when they are worse brings tears to her eyes. Pharm is [facility] on [address] in [place]. Please advise. Pt # [phone].

## 2024-11-16 ENCOUNTER — Ambulatory Visit

## 2024-11-17 ENCOUNTER — Ambulatory Visit

## 2024-11-17 ENCOUNTER — Ambulatory Visit: Admitting: Family Medicine

## 2024-11-17 ENCOUNTER — Encounter: Payer: Self-pay | Admitting: Family Medicine

## 2024-11-17 VITALS — BP 132/82 | HR 64 | Ht 65.0 in | Wt 267.5 lb

## 2024-11-17 DIAGNOSIS — I10 Essential (primary) hypertension: Secondary | ICD-10-CM | POA: Diagnosis not present

## 2024-11-17 DIAGNOSIS — G4709 Other insomnia: Secondary | ICD-10-CM | POA: Diagnosis not present

## 2024-11-17 DIAGNOSIS — C50912 Malignant neoplasm of unspecified site of left female breast: Secondary | ICD-10-CM | POA: Diagnosis not present

## 2024-11-17 DIAGNOSIS — G893 Neoplasm related pain (acute) (chronic): Secondary | ICD-10-CM | POA: Diagnosis not present

## 2024-11-17 DIAGNOSIS — Z6841 Body Mass Index (BMI) 40.0 and over, adult: Secondary | ICD-10-CM | POA: Diagnosis not present

## 2024-11-17 DIAGNOSIS — G43709 Chronic migraine without aura, not intractable, without status migrainosus: Secondary | ICD-10-CM | POA: Diagnosis not present

## 2024-11-17 DIAGNOSIS — C50512 Malignant neoplasm of lower-outer quadrant of left female breast: Secondary | ICD-10-CM | POA: Insufficient documentation

## 2024-11-17 MED ORDER — LOSARTAN POTASSIUM 25 MG PO TABS: 25.0000 mg | ORAL_TABLET | Freq: Every day | ORAL | 2 refills | Status: AC

## 2024-11-17 MED ORDER — OXYCODONE HCL 10 MG PO TABS: 10.0000 mg | ORAL_TABLET | ORAL | 0 refills | Status: AC | PRN

## 2024-11-17 MED ORDER — TRAZODONE HCL 50 MG PO TABS: 25.0000 mg | ORAL_TABLET | Freq: Every evening | ORAL | 3 refills | Status: AC | PRN

## 2024-11-17 MED ORDER — SUMATRIPTAN SUCCINATE 25 MG PO TABS: 25.0000 mg | ORAL_TABLET | ORAL | 0 refills | Status: AC | PRN

## 2024-11-17 NOTE — Progress Notes (Signed)
 "  Established Patient Office Visit  Patient ID: Vickie Vickie M Kindle, female    DOB: [birth year]  Age: 59 y.o. MRN: [id number] PCP: Sharma Coyer, MD  Chief Complaint  Patient presents with   Medical Management of Chronic Issues    Patient is present for f/u hair loss, BP and weight     Subjective:     HPI  Discussed the use of AI scribe software for clinical note transcription with the patient, who gave verbal consent to proceed.  History of Present Illness Vickie Gutierrez is a 59 year old female with chronic hypertension who presents for follow-up.  She has been recently diagnosed with ER, PR positive, HER2 negative invasive breast cancer on the left side. She has undergone a lumpectomy and is currently receiving daily radiation therapy for four and a half weeks. She describes the process as overwhelming, with significant fatigue and pain post-biopsy and during radiation. She experiences shooting pains, which she compares to labor pains, and is taking oxycodone  5 mg for pain, which she feels is insufficient.  She experiences sleep disturbances, often waking up and unable to return to sleep. She sometimes uses over-the-counter sleep aids but finds ibuprofen  more effective in aiding sleep. She reports a sore spot and pain when lying on her side, affecting her sleep quality.  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  She reports high blood pressure readings, with systolic values in the 140s to 150s and diastolic values up to 108. She is currently taking meloxicam  for knee pain, which she alternates with ibuprofen .  She experiences headaches but notes they have decreased since being out of work. She uses Imitrex  25 mg as needed for headaches, with a maximum of two doses per day.  She mentions a weight loss from 272 to 267 pounds and wants to lose more weight by June for  horseback riding.   Patient Active Problem List   Diagnosis Date Noted   Malignant neoplasm of lower-outer quadrant of left breast of female, estrogen receptor positive (HCC) 11/17/2024   Other insomnia 11/17/2024   Cancer-related pain 11/17/2024   Invasive ductal carcinoma of left breast (HCC) 09/09/2024   Hypertension 05/09/2017   Reactive depression 05/09/2017   BMI 40.0-44.9, adult (HCC) 01/08/2017   Environmental and seasonal allergies 01/08/2017   Migraine without status migrainosus, not intractable 01/08/2017   Arthralgia of right knee 01/08/2017   Past Medical History:  Diagnosis Date   Allergy    seasonal   Anemia    Depression    Hypertension    no medications   Invasive ductal carcinoma of left breast (HCC)    Migraines    Morbid obesity (HCC)       ROS    Objective:     BP 132/82 (Cuff Size: Large)   Pulse 64   Ht 5' 5 (1.651 m)   Wt 267 lb 8 oz (121.3 kg)   LMP 10/22/2012 Comment: no periods.   SpO2 100%   BMI 44.51 kg/m  BP Readings from Last 3 Encounters:  11/17/24 132/82  10/27/24 (!) 145/105  10/19/24 (!) 144/88   Wt Readings from Last 3 Encounters:  11/17/24 267 lb 8 oz (121.3 kg)  10/27/24 272 lb (123.4 kg)  10/19/24 270 lb 12.8 oz (122.8 kg)      Physical Exam Vitals reviewed.  Constitutional:      General: She  is not in acute distress.    Appearance: Normal appearance. She is not ill-appearing.  Cardiovascular:     Rate and Rhythm: Normal rate and regular rhythm.  Pulmonary:     Effort: Pulmonary effort is normal. No respiratory distress.     Breath sounds: No wheezing, rhonchi or rales.  Neurological:     Mental Status: She is alert and oriented to person, place, and time.  Psychiatric:        Mood and Affect: Mood normal.        Behavior: Behavior normal.      No results found for any visits on 11/17/24.  Last CBC Lab Results  Component Value Date   WBC 8.5 09/29/2024   HGB 12.5 09/29/2024   HCT 40.3 09/29/2024    MCV 89.0 09/29/2024   MCH 27.6 09/29/2024   RDW 13.2 09/29/2024   PLT 274 09/29/2024   Last metabolic panel Lab Results  Component Value Date   GLUCOSE 81 09/29/2024   NA 138 09/29/2024   K 4.1 09/29/2024   CL 103 09/29/2024   CO2 25 09/29/2024   BUN 17 09/29/2024   CREATININE 0.90 09/29/2024   GFRNONAA >60 09/29/2024   CALCIUM 9.0 09/29/2024   PROT 7.3 05/09/2017   ALBUMIN 4.2 05/09/2017   LABGLOB 3.1 05/09/2017   AGRATIO 1.4 05/09/2017   BILITOT 0.2 05/09/2017   ALKPHOS 95 05/09/2017   AST 8 05/09/2017   ALT 9 05/09/2017   ANIONGAP 11 09/29/2024   Last lipids Lab Results  Component Value Date   CHOL 143 05/09/2017   HDL 51 05/09/2017   LDLCALC 76 05/09/2017   TRIG 79 05/09/2017   CHOLHDL 2.8 05/09/2017   Last hemoglobin A1c No results found for: HGBA1C Last thyroid  functions Lab Results  Component Value Date   TSH 2.260 05/09/2017   Last vitamin D No results found for: 25OHVITD2, 25OHVITD3, VD25OH Last vitamin B12 and Folate No results found for: VITAMINB12, FOLATE    The ASCVD Risk score (Arnett DK, et al., 2019) failed to calculate for the following reasons:   Cannot find a previous HDL lab   Cannot find a previous total cholesterol lab  Outpatient Encounter Medications as of 11/17/2024  Medication Sig   acetaminophen  (TYLENOL ) 500 MG tablet Take 2 tablets (1,000 mg total) by mouth every 6 (six) hours as needed for mild pain (pain score 1-3).   ibuprofen  (ADVIL ) 600 MG tablet Take 1 tablet (600 mg total) by mouth every 8 (eight) hours as needed for moderate pain (pain score 4-6).   losartan  (COZAAR ) 25 MG tablet Take 1 tablet (25 mg total) by mouth daily.   meloxicam  (MOBIC ) 15 MG tablet Take 1 tablet (15 mg total) by mouth daily as needed for pain. Do Not take within 24 hours of ibuprofen /other NSAIDs   metFORMIN  (GLUCOPHAGE -XR) 500 MG 24 hr tablet Take 1 tablet (500 mg total) by mouth daily with breakfast.   traZODone  (DESYREL ) 50 MG  tablet Take 0.5-1 tablets (25-50 mg total) by mouth at bedtime as needed for sleep.   [DISCONTINUED] oxyCODONE  (OXY IR/ROXICODONE ) 5 MG immediate release tablet Take 1 tablet (5 mg total) by mouth every 4 (four) hours as needed for severe pain (pain score 7-10).   [DISCONTINUED] SUMAtriptan  (IMITREX ) 25 MG tablet Take 1 tablet (25 mg total) by mouth every 2 (two) hours as needed for migraine. May repeat in 2 hours if headache persists or recurs.   oxyCODONE  10 MG TABS Take 1 tablet (10 mg  total) by mouth every 4 (four) hours as needed for severe pain (pain score 7-10).   SUMAtriptan  (IMITREX ) 25 MG tablet Take 1 tablet (25 mg total) by mouth every 2 (two) hours as needed for migraine. May repeat in 2 hours if headache persists or recurs.   No facility-administered encounter medications on file as of 11/17/2024.       Assessment & Plan:   Problem List Items Addressed This Visit     BMI 40.0-44.9, adult (HCC)   Class III Obesity  Chronic  BmI 44.5 Recommended monitoring for now until she has been able to recover from breast cancer treatment  No recommendation to start weight loss agents for now       Cancer-related pain    Cancer-related pain Chronic  Experiencing significant pain post-radiation, described as shooting pains that can be severe. Pain management is challenging due to the intensity and frequency of the pain. Pain may last up to a year as nerve healing occurs. - Increased oxycodone  to 10 mg every 4 hours as needed for pain. - Prescribed Imitrex  25 mg for headaches, with a maximum of two doses per day.      Hypertension - Primary   Primary hypertension Chronic  Blood pressure readings are elevated, with systolic readings in the 140s-150s and diastolic readings in the 80s-108. Current management is inadequate, necessitating adjustment of antihypertensive therapy. Losartan  is chosen for its renal protective effects, but caution is advised with NSAIDs due to potential renal  impact. - Started losartan  25 mg once daily. - Instructed to monitor blood pressure daily for one week. - If blood pressure remains elevated, will increase losartan  to 50 mg once daily. - Advised to avoid NSAIDs like ibuprofen  and meloxicam  while on losartan .       Relevant Medications   losartan  (COZAAR ) 25 MG tablet   Malignant neoplasm of lower-outer quadrant of left breast of female, estrogen receptor positive (HCC)   Invasive ductal carcinoma of left breast Active treatment  ER, PR positive, HER2 negative invasive ductal carcinoma of the left breast. Currently undergoing radiation therapy, which is intense and causing fatigue. Concerns about potential hair loss, skin irritation, and other side effects from radiation. Oncologist plans to start hormone therapy post-radiation. - Continue radiation therapy as scheduled. - Use cortisone or Benadryl gel for skin irritation as needed. - Will coordinate with oncologist for post-radiation hormone therapy.      Relevant Medications   oxyCODONE  10 MG TABS   Migraine without status migrainosus, not intractable   Chronic migraine Migraines are less frequent, but Imitrex  is available for acute management. - Prescribed Imitrex  25 mg for acute migraine management, with a maximum of two doses per day.      Relevant Medications   oxyCODONE  10 MG TABS   SUMAtriptan  (IMITREX ) 25 MG tablet   losartan  (COZAAR ) 25 MG tablet   traZODone  (DESYREL ) 50 MG tablet   Other insomnia     Insomnia Chronic issue since breast cancer diagnosis  Difficulty sleeping, with frequent awakenings and inability to return to sleep. Current sleep aids are not effective. Trazodone  is considered for its sedative effects. - Prescribed trazodone  25 mg at night, with the option to increase to 50 mg if needed.       Relevant Medications   traZODone  (DESYREL ) 50 MG tablet    Assessment and Plan Assessment & Plan        Return in about 6 weeks (around  12/29/2024) for breast cancer pain f/u, HTN  ,  HTN, Insomnia.    Rockie Agent, MD [facility]  "

## 2024-11-17 NOTE — Assessment & Plan Note (Signed)
 Chronic migraine Migraines are less frequent, but Imitrex  is available for acute management. - Prescribed Imitrex  25 mg for acute migraine management, with a maximum of two doses per day.

## 2024-11-17 NOTE — Patient Instructions (Addendum)
" °  VISIT SUMMARY: Today, we discussed your ongoing treatment for breast cancer, pain management, high blood pressure, migraines, and sleep disturbances. We adjusted your medications and provided recommendations to help manage your symptoms.  YOUR PLAN: INVASIVE DUCTAL CARCINOMA OF LEFT BREAST: You are undergoing radiation therapy for breast cancer, which is causing fatigue and other side effects. -Continue radiation therapy as scheduled. -Use cortisone or Benadryl gel for skin irritation as needed. -We will coordinate with your oncologist for post-radiation hormone therapy.  CANCER-RELATED PAIN: You are experiencing significant pain from radiation therapy. -Increase oxycodone  to 10 mg every 4 hours as needed for pain. -Continue using Imitrex  25 mg for headaches, with a maximum of two doses per day.  PRIMARY HYPERTENSION: Your blood pressure readings are elevated and need better management. -Start taking losartan  25 mg once daily. -Monitor your blood pressure daily for one week. -If blood pressure remains elevated, we will increase losartan  to 50 mg once daily. -Avoid NSAIDs like ibuprofen  and meloxicam  while on losartan .  CHRONIC MIGRAINE: Your migraines are less frequent, but you have medication for acute management. -Continue using Imitrex  25 mg for acute migraine management, with a maximum of two doses per day.  INSOMNIA: You have difficulty sleeping and current sleep aids are not effective. -Start taking trazodone  25 mg at night, with the option to increase to 50 mg if needed.    Contains text generated by [employer].       To keep you healthy, please keep in mind the following health maintenance items that you are due for:   Health Maintenance Due  Topic Date Due   COVID-19 Vaccine (1) Never done   Diabetic kidney evaluation - Urine ACR  Never done   Hepatitis C Screening  Never done   DTaP/Tdap/Td (1 - Tdap) Never done   Hepatitis B Vaccines 19-59 Average Risk (1 of 3 - 19+  3-dose series) Never done   Zoster Vaccines- Shingrix (1 of 2) Never done   Pneumococcal Vaccine: 50+ Years (1 of 1 - PCV) Never done   Cervical Cancer Screening (HPV/Pap Cotest)  05/09/2022   Influenza Vaccine  05/22/2024     Best Wishes,   Dr. Lang  "

## 2024-11-17 NOTE — Assessment & Plan Note (Signed)
" °  Cancer-related pain Chronic  Experiencing significant pain post-radiation, described as shooting pains that can be severe. Pain management is challenging due to the intensity and frequency of the pain. Pain may last up to a year as nerve healing occurs. - Increased oxycodone  to 10 mg every 4 hours as needed for pain. - Prescribed Imitrex  25 mg for headaches, with a maximum of two doses per day. "

## 2024-11-17 NOTE — Assessment & Plan Note (Signed)
 Class III Obesity  Chronic  BmI 44.5 Recommended monitoring for now until she has been able to recover from breast cancer treatment  No recommendation to start weight loss agents for now

## 2024-11-17 NOTE — Assessment & Plan Note (Signed)
 Invasive ductal carcinoma of left breast Active treatment  ER, PR positive, HER2 negative invasive ductal carcinoma of the left breast. Currently undergoing radiation therapy, which is intense and causing fatigue. Concerns about potential hair loss, skin irritation, and other side effects from radiation. Oncologist plans to start hormone therapy post-radiation. - Continue radiation therapy as scheduled. - Use cortisone or Benadryl gel for skin irritation as needed. - Will coordinate with oncologist for post-radiation hormone therapy.

## 2024-11-17 NOTE — Assessment & Plan Note (Signed)
 Primary hypertension Chronic  Blood pressure readings are elevated, with systolic readings in the 140s-150s and diastolic readings in the 80s-108. Current management is inadequate, necessitating adjustment of antihypertensive therapy. Losartan  is chosen for its renal protective effects, but caution is advised with NSAIDs due to potential renal impact. - Started losartan  25 mg once daily. - Instructed to monitor blood pressure daily for one week. - If blood pressure remains elevated, will increase losartan  to 50 mg once daily. - Advised to avoid NSAIDs like ibuprofen  and meloxicam  while on losartan .

## 2024-11-17 NOTE — Assessment & Plan Note (Signed)
" ° °  Insomnia Chronic issue since breast cancer diagnosis  Difficulty sleeping, with frequent awakenings and inability to return to sleep. Current sleep aids are not effective. Trazodone  is considered for its sedative effects. - Prescribed trazodone  25 mg at night, with the option to increase to 50 mg if needed.  "

## 2024-11-18 ENCOUNTER — Telehealth: Payer: Self-pay

## 2024-11-18 ENCOUNTER — Ambulatory Visit
Admission: RE | Admit: 2024-11-18 | Discharge: 2024-11-18 | Disposition: A | Source: Ambulatory Visit | Attending: Radiation Oncology | Admitting: Radiation Oncology

## 2024-11-18 ENCOUNTER — Other Ambulatory Visit: Payer: Self-pay

## 2024-11-18 DIAGNOSIS — C50412 Malignant neoplasm of upper-outer quadrant of left female breast: Secondary | ICD-10-CM | POA: Diagnosis not present

## 2024-11-18 LAB — RAD ONC ARIA SESSION SUMMARY
Course Elapsed Days: 0
Plan Fractions Treated to Date: 1
Plan Prescribed Dose Per Fraction: 2.66 Gy
Plan Total Fractions Prescribed: 16
Plan Total Prescribed Dose: 42.56 Gy
Reference Point Dosage Given to Date: 2.66 Gy
Reference Point Session Dosage Given: 2.66 Gy
Session Number: 1

## 2024-11-18 NOTE — Telephone Encounter (Signed)
 Pt's daughter dropped off [employer] forms to be completed.  I am not sure if they are the same forms that were already received so placing them in providers box for review.  Pt would like the forms faxed after completed and would also like a copy left at the front desk for her to pick up.  Please let pt know when complete.

## 2024-11-19 ENCOUNTER — Other Ambulatory Visit: Payer: Self-pay

## 2024-11-19 ENCOUNTER — Ambulatory Visit
Admission: RE | Admit: 2024-11-19 | Discharge: 2024-11-19 | Disposition: A | Discharge status: Home / Self Care | Source: Ambulatory Visit | Attending: Radiation Oncology | Admitting: Radiation Oncology

## 2024-11-19 DIAGNOSIS — Z0279 Encounter for issue of other medical certificate: Secondary | ICD-10-CM

## 2024-11-19 DIAGNOSIS — C50412 Malignant neoplasm of upper-outer quadrant of left female breast: Secondary | ICD-10-CM | POA: Diagnosis not present

## 2024-11-19 LAB — RAD ONC ARIA SESSION SUMMARY
Course Elapsed Days: 1
Plan Fractions Treated to Date: 2
Plan Prescribed Dose Per Fraction: 2.66 Gy
Plan Total Fractions Prescribed: 16
Plan Total Prescribed Dose: 42.56 Gy
Reference Point Dosage Given to Date: 5.32 Gy
Reference Point Session Dosage Given: 2.66 Gy
Session Number: 2

## 2024-11-19 NOTE — Telephone Encounter (Signed)
 FMLA forms along with OV notes as requested have been attached and forms have been completed requesting pt to return to work on 01/08/25 after she has completed radiation treatments and been able to follow up with her oncologist.   Forms were completed on 11/19/24 and placed in reception area of [address] in the fax pile  Please also call pt to pick up a copy of the FMLA forms as requested

## 2024-11-20 ENCOUNTER — Telehealth: Payer: Self-pay

## 2024-11-20 ENCOUNTER — Other Ambulatory Visit: Payer: Self-pay

## 2024-11-20 ENCOUNTER — Ambulatory Visit
Admission: RE | Admit: 2024-11-20 | Discharge: 2024-11-20 | Disposition: A | Source: Ambulatory Visit | Attending: Radiation Oncology | Admitting: Radiation Oncology

## 2024-11-20 DIAGNOSIS — C50412 Malignant neoplasm of upper-outer quadrant of left female breast: Secondary | ICD-10-CM | POA: Diagnosis not present

## 2024-11-20 LAB — RAD ONC ARIA SESSION SUMMARY
Course Elapsed Days: 2
Plan Fractions Treated to Date: 3
Plan Prescribed Dose Per Fraction: 2.66 Gy
Plan Total Fractions Prescribed: 16
Plan Total Prescribed Dose: 42.56 Gy
Reference Point Dosage Given to Date: 7.98 Gy
Reference Point Session Dosage Given: 2.66 Gy
Session Number: 3

## 2024-11-20 NOTE — Telephone Encounter (Signed)
 FMLA forms have been faxed to [employer] at [phone]. Called pt and informed her that FMLA forms have been faxed and ready for pickup. Advised pt of hours.

## 2024-11-20 NOTE — Telephone Encounter (Signed)
 CSW Intern attempted to contact patient by phone in response to a VM patient left regarding transportation to appointments. Intern left VM for patient and sent message to Samule Bertrand re transportation.  Thersia Daring Clinical Social Work Intern [employer]

## 2024-11-23 ENCOUNTER — Inpatient Hospital Stay: Attending: Oncology

## 2024-11-23 ENCOUNTER — Ambulatory Visit

## 2024-11-24 ENCOUNTER — Other Ambulatory Visit: Payer: Self-pay

## 2024-11-24 ENCOUNTER — Ambulatory Visit
Admission: RE | Admit: 2024-11-24 | Discharge: 2024-11-24 | Attending: Radiation Oncology | Admitting: Radiation Oncology

## 2024-11-24 LAB — RAD ONC ARIA SESSION SUMMARY
Course Elapsed Days: 6
Plan Fractions Treated to Date: 4
Plan Prescribed Dose Per Fraction: 2.66 Gy
Plan Total Fractions Prescribed: 16
Plan Total Prescribed Dose: 42.56 Gy
Reference Point Dosage Given to Date: 10.64 Gy
Reference Point Session Dosage Given: 2.66 Gy
Session Number: 4

## 2024-11-25 ENCOUNTER — Other Ambulatory Visit: Payer: Self-pay

## 2024-11-25 ENCOUNTER — Ambulatory Visit

## 2024-11-25 LAB — RAD ONC ARIA SESSION SUMMARY
Course Elapsed Days: 7
Plan Fractions Treated to Date: 5
Plan Prescribed Dose Per Fraction: 2.66 Gy
Plan Total Fractions Prescribed: 16
Plan Total Prescribed Dose: 42.56 Gy
Reference Point Dosage Given to Date: 13.3 Gy
Reference Point Session Dosage Given: 2.66 Gy
Session Number: 5

## 2024-11-26 ENCOUNTER — Ambulatory Visit

## 2024-11-26 ENCOUNTER — Other Ambulatory Visit: Payer: Self-pay

## 2024-11-26 LAB — RAD ONC ARIA SESSION SUMMARY
Course Elapsed Days: 8
Plan Fractions Treated to Date: 6
Plan Prescribed Dose Per Fraction: 2.66 Gy
Plan Total Fractions Prescribed: 16
Plan Total Prescribed Dose: 42.56 Gy
Reference Point Dosage Given to Date: 15.96 Gy
Reference Point Session Dosage Given: 2.66 Gy
Session Number: 6

## 2024-11-27 ENCOUNTER — Ambulatory Visit: Admission: RE | Admit: 2024-11-27

## 2024-11-27 ENCOUNTER — Other Ambulatory Visit: Payer: Self-pay

## 2024-11-27 LAB — RAD ONC ARIA SESSION SUMMARY
Course Elapsed Days: 9
Plan Fractions Treated to Date: 7
Plan Prescribed Dose Per Fraction: 2.66 Gy
Plan Total Fractions Prescribed: 16
Plan Total Prescribed Dose: 42.56 Gy
Reference Point Dosage Given to Date: 18.62 Gy
Reference Point Session Dosage Given: 2.66 Gy
Session Number: 7

## 2024-11-30 ENCOUNTER — Ambulatory Visit

## 2024-12-01 ENCOUNTER — Ambulatory Visit

## 2024-12-02 ENCOUNTER — Ambulatory Visit

## 2024-12-03 ENCOUNTER — Ambulatory Visit

## 2024-12-04 ENCOUNTER — Ambulatory Visit

## 2024-12-07 ENCOUNTER — Ambulatory Visit

## 2024-12-08 ENCOUNTER — Ambulatory Visit

## 2024-12-09 ENCOUNTER — Ambulatory Visit

## 2024-12-09 ENCOUNTER — Inpatient Hospital Stay

## 2024-12-10 ENCOUNTER — Ambulatory Visit

## 2024-12-11 ENCOUNTER — Ambulatory Visit

## 2024-12-14 ENCOUNTER — Ambulatory Visit

## 2024-12-15 ENCOUNTER — Ambulatory Visit

## 2024-12-16 ENCOUNTER — Ambulatory Visit

## 2024-12-17 ENCOUNTER — Ambulatory Visit

## 2024-12-18 ENCOUNTER — Ambulatory Visit

## 2024-12-21 ENCOUNTER — Inpatient Hospital Stay: Attending: Oncology

## 2024-12-21 ENCOUNTER — Ambulatory Visit

## 2024-12-21 ENCOUNTER — Inpatient Hospital Stay: Admitting: Oncology

## 2024-12-22 ENCOUNTER — Ambulatory Visit

## 2024-12-23 ENCOUNTER — Ambulatory Visit

## 2024-12-24 ENCOUNTER — Ambulatory Visit

## 2024-12-25 ENCOUNTER — Ambulatory Visit

## 2024-12-30 ENCOUNTER — Ambulatory Visit: Admitting: Family Medicine

## 2025-01-07 ENCOUNTER — Inpatient Hospital Stay: Admitting: *Deleted

## 2025-01-12 ENCOUNTER — Ambulatory Visit
